# Patient Record
Sex: Female | Born: 1985 | Race: White | Hispanic: No | Marital: Married | Smoking: Never smoker
Health system: Southern US, Community
[De-identification: ages and names within clinical notes are randomized; demographics above are authoritative.]

## PROBLEM LIST (undated history)

## (undated) DIAGNOSIS — F419 Anxiety disorder, unspecified: Secondary | ICD-10-CM

## (undated) DIAGNOSIS — N200 Calculus of kidney: Secondary | ICD-10-CM

## (undated) DIAGNOSIS — I10 Essential (primary) hypertension: Secondary | ICD-10-CM

## (undated) HISTORY — PX: TONSILLECTOMY: SUR1361

## (undated) HISTORY — PX: OTHER SURGICAL HISTORY: SHX169

---

## 2019-06-27 NOTE — L&D Delivery Note (Signed)
OB/GYN Faculty Practice Delivery Note  . Alexandra George is a 34 y.o. G3P1002 s/p SVD at [redacted]w[redacted]d.     Delivery Date/Time: 1351 on 10/19  Delivery: Called to room by RN and patient was complete and pushing. Head delivered LOA. No nuchal cord present. Shoulder and body delivered in usual fashion. Infant with spontaneous cry, placed on mother's abdomen, dried and stimulated. Cord clamped x 2 after 1-minute delay, and cut by FOB. Cord blood drawn. Dr. Amado Nash arrived at this time and took over patient's care.    Complications: None Analgesia: Epidural   [name], MD [facility] Family Medicine Fellow, [facility] for [facility], [facility] 04/13/2020, 2:02 PM

## 2019-08-19 DIAGNOSIS — Z3689 Encounter for other specified antenatal screening: Secondary | ICD-10-CM | POA: Diagnosis not present

## 2019-08-19 DIAGNOSIS — Z3200 Encounter for pregnancy test, result unknown: Secondary | ICD-10-CM | POA: Diagnosis not present

## 2019-09-08 DIAGNOSIS — Z3A01 Less than 8 weeks gestation of pregnancy: Secondary | ICD-10-CM | POA: Diagnosis not present

## 2019-09-08 DIAGNOSIS — O418X10 Other specified disorders of amniotic fluid and membranes, first trimester, not applicable or unspecified: Secondary | ICD-10-CM | POA: Diagnosis not present

## 2019-10-01 DIAGNOSIS — O418X10 Other specified disorders of amniotic fluid and membranes, first trimester, not applicable or unspecified: Secondary | ICD-10-CM | POA: Diagnosis not present

## 2019-10-01 DIAGNOSIS — Z3A11 11 weeks gestation of pregnancy: Secondary | ICD-10-CM | POA: Diagnosis not present

## 2019-10-01 DIAGNOSIS — Z3689 Encounter for other specified antenatal screening: Secondary | ICD-10-CM | POA: Diagnosis not present

## 2019-10-01 DIAGNOSIS — Z3481 Encounter for supervision of other normal pregnancy, first trimester: Secondary | ICD-10-CM | POA: Diagnosis not present

## 2019-10-01 DIAGNOSIS — Z118 Encounter for screening for other infectious and parasitic diseases: Secondary | ICD-10-CM | POA: Diagnosis not present

## 2019-10-01 LAB — OB RESULTS CONSOLE RUBELLA ANTIBODY, IGM: Rubella: IMMUNE

## 2019-10-01 LAB — OB RESULTS CONSOLE HEPATITIS B SURFACE ANTIGEN: Hepatitis B Surface Ag: NEGATIVE

## 2019-10-01 LAB — OB RESULTS CONSOLE HIV ANTIBODY (ROUTINE TESTING): HIV: NONREACTIVE

## 2019-10-03 ENCOUNTER — Inpatient Hospital Stay (HOSPITAL_COMMUNITY): Admission: AD | Admit: 2019-10-03 | Payer: Self-pay | Source: Home / Self Care | Admitting: Obstetrics & Gynecology

## 2019-10-23 DIAGNOSIS — Z3481 Encounter for supervision of other normal pregnancy, first trimester: Secondary | ICD-10-CM | POA: Diagnosis not present

## 2019-10-30 DIAGNOSIS — Z3A15 15 weeks gestation of pregnancy: Secondary | ICD-10-CM | POA: Diagnosis not present

## 2019-10-30 DIAGNOSIS — Z361 Encounter for antenatal screening for raised alphafetoprotein level: Secondary | ICD-10-CM | POA: Diagnosis not present

## 2019-10-30 DIAGNOSIS — O418X10 Other specified disorders of amniotic fluid and membranes, first trimester, not applicable or unspecified: Secondary | ICD-10-CM | POA: Diagnosis not present

## 2019-11-27 DIAGNOSIS — O418X10 Other specified disorders of amniotic fluid and membranes, first trimester, not applicable or unspecified: Secondary | ICD-10-CM | POA: Diagnosis not present

## 2019-11-27 DIAGNOSIS — Z363 Encounter for antenatal screening for malformations: Secondary | ICD-10-CM | POA: Diagnosis not present

## 2019-11-27 DIAGNOSIS — Z3A19 19 weeks gestation of pregnancy: Secondary | ICD-10-CM | POA: Diagnosis not present

## 2019-12-12 DIAGNOSIS — O418X20 Other specified disorders of amniotic fluid and membranes, second trimester, not applicable or unspecified: Secondary | ICD-10-CM | POA: Diagnosis not present

## 2019-12-12 DIAGNOSIS — Z362 Encounter for other antenatal screening follow-up: Secondary | ICD-10-CM | POA: Diagnosis not present

## 2019-12-12 DIAGNOSIS — Z3A21 21 weeks gestation of pregnancy: Secondary | ICD-10-CM | POA: Diagnosis not present

## 2020-01-09 DIAGNOSIS — O418X20 Other specified disorders of amniotic fluid and membranes, second trimester, not applicable or unspecified: Secondary | ICD-10-CM | POA: Diagnosis not present

## 2020-01-09 DIAGNOSIS — Z3A25 25 weeks gestation of pregnancy: Secondary | ICD-10-CM | POA: Diagnosis not present

## 2020-01-22 DIAGNOSIS — Z3A27 27 weeks gestation of pregnancy: Secondary | ICD-10-CM | POA: Diagnosis not present

## 2020-01-22 DIAGNOSIS — Z3689 Encounter for other specified antenatal screening: Secondary | ICD-10-CM | POA: Diagnosis not present

## 2020-01-22 DIAGNOSIS — O418X20 Other specified disorders of amniotic fluid and membranes, second trimester, not applicable or unspecified: Secondary | ICD-10-CM | POA: Diagnosis not present

## 2020-02-04 DIAGNOSIS — Z3482 Encounter for supervision of other normal pregnancy, second trimester: Secondary | ICD-10-CM | POA: Diagnosis not present

## 2020-02-04 DIAGNOSIS — Z3689 Encounter for other specified antenatal screening: Secondary | ICD-10-CM | POA: Diagnosis not present

## 2020-02-04 LAB — OB RESULTS CONSOLE RPR: RPR: NONREACTIVE

## 2020-02-18 DIAGNOSIS — O9963 Diseases of the digestive system complicating the puerperium: Secondary | ICD-10-CM | POA: Diagnosis not present

## 2020-02-18 DIAGNOSIS — Z3A31 31 weeks gestation of pregnancy: Secondary | ICD-10-CM | POA: Diagnosis not present

## 2020-02-18 DIAGNOSIS — O3663X0 Maternal care for excessive fetal growth, third trimester, not applicable or unspecified: Secondary | ICD-10-CM | POA: Diagnosis not present

## 2020-02-22 ENCOUNTER — Inpatient Hospital Stay (HOSPITAL_COMMUNITY)
Admission: AD | Admit: 2020-02-22 | Discharge: 2020-02-23 | Disposition: A | Discharge status: Home / Self Care | Payer: BC Managed Care – PPO | Attending: Obstetrics and Gynecology | Admitting: Obstetrics and Gynecology

## 2020-02-22 ENCOUNTER — Other Ambulatory Visit: Payer: Self-pay

## 2020-02-22 ENCOUNTER — Encounter (HOSPITAL_COMMUNITY): Payer: Self-pay | Admitting: Obstetrics and Gynecology

## 2020-02-22 ENCOUNTER — Inpatient Hospital Stay (HOSPITAL_COMMUNITY): Payer: BC Managed Care – PPO

## 2020-02-22 DIAGNOSIS — O26893 Other specified pregnancy related conditions, third trimester: Secondary | ICD-10-CM | POA: Diagnosis not present

## 2020-02-22 DIAGNOSIS — Z3A31 31 weeks gestation of pregnancy: Secondary | ICD-10-CM | POA: Diagnosis not present

## 2020-02-22 DIAGNOSIS — R319 Hematuria, unspecified: Secondary | ICD-10-CM | POA: Diagnosis not present

## 2020-02-22 DIAGNOSIS — Z3689 Encounter for other specified antenatal screening: Secondary | ICD-10-CM

## 2020-02-22 DIAGNOSIS — Z880 Allergy status to penicillin: Secondary | ICD-10-CM | POA: Diagnosis not present

## 2020-02-22 DIAGNOSIS — M545 Low back pain, unspecified: Secondary | ICD-10-CM

## 2020-02-22 LAB — CBC WITH DIFFERENTIAL/PLATELET
Abs Immature Granulocytes: 0.43 10*3/uL — ABNORMAL HIGH (ref 0.00–0.07)
Basophils Absolute: 0.1 10*3/uL (ref 0.0–0.1)
Basophils Relative: 0 %
Eosinophils Absolute: 0.1 10*3/uL (ref 0.0–0.5)
Eosinophils Relative: 1 %
HCT: 32.9 % — ABNORMAL LOW (ref 36.0–46.0)
Hemoglobin: 10.6 g/dL — ABNORMAL LOW (ref 12.0–15.0)
Immature Granulocytes: 3 %
Lymphocytes Relative: 9 %
Lymphs Abs: 1.2 10*3/uL (ref 0.7–4.0)
MCH: 29.9 pg (ref 26.0–34.0)
MCHC: 32.2 g/dL (ref 30.0–36.0)
MCV: 92.9 fL (ref 80.0–100.0)
Monocytes Absolute: 0.8 10*3/uL (ref 0.1–1.0)
Monocytes Relative: 6 %
Neutro Abs: 10.2 10*3/uL — ABNORMAL HIGH (ref 1.7–7.7)
Neutrophils Relative %: 81 %
Platelets: 280 10*3/uL (ref 150–400)
RBC: 3.54 MIL/uL — ABNORMAL LOW (ref 3.87–5.11)
RDW: 14.8 % (ref 11.5–15.5)
WBC: 12.8 10*3/uL — ABNORMAL HIGH (ref 4.0–10.5)
nRBC: 0 % (ref 0.0–0.2)

## 2020-02-22 LAB — URINALYSIS, ROUTINE W REFLEX MICROSCOPIC
Bilirubin Urine: NEGATIVE
Glucose, UA: NEGATIVE mg/dL
Ketones, ur: 20 mg/dL — AB
Leukocytes,Ua: NEGATIVE
Nitrite: NEGATIVE
Protein, ur: 30 mg/dL — AB
RBC / HPF: >50 RBC/hpf — ABNORMAL HIGH (ref 0–5)
Specific Gravity, Urine: 1.017 (ref 1.005–1.030)
pH: 6 (ref 5.0–8.0)

## 2020-02-22 LAB — COMPREHENSIVE METABOLIC PANEL
ALT: 20 U/L (ref 0–44)
AST: 18 U/L (ref 15–41)
Albumin: 2.7 g/dL — ABNORMAL LOW (ref 3.5–5.0)
Alkaline Phosphatase: 60 U/L (ref 38–126)
Anion gap: 10 (ref 5–15)
BUN: 5 mg/dL — ABNORMAL LOW (ref 6–20)
CO2: 22 mmol/L (ref 22–32)
Calcium: 8.9 mg/dL (ref 8.9–10.3)
Chloride: 105 mmol/L (ref 98–111)
Creatinine, Ser: 0.98 mg/dL (ref 0.44–1.00)
GFR calc Af Amer: >60 mL/min (ref >60)
GFR calc non Af Amer: >60 mL/min (ref >60)
Glucose, Bld: 92 mg/dL (ref 70–99)
Potassium: 3.5 mmol/L (ref 3.5–5.1)
Sodium: 137 mmol/L (ref 135–145)
Total Bilirubin: 1.4 mg/dL — ABNORMAL HIGH (ref 0.3–1.2)
Total Protein: 5.6 g/dL — ABNORMAL LOW (ref 6.5–8.1)

## 2020-02-22 MED ORDER — HYDROMORPHONE HCL 1 MG/ML IJ SOLN
1.0000 mg | Freq: Once | INTRAMUSCULAR | Status: AC
  Administered 2020-02-22: 1 mg via INTRAVENOUS
  Filled 2020-02-22: qty 1

## 2020-02-22 MED ORDER — SODIUM CHLORIDE 0.9 % IV SOLN
8.0000 mg | Freq: Once | INTRAVENOUS | Status: AC
  Administered 2020-02-22: 8 mg via INTRAVENOUS
  Filled 2020-02-22: qty 4

## 2020-02-22 MED ORDER — LACTATED RINGERS IV SOLN: Freq: Once | INTRAVENOUS | Status: AC

## 2020-02-22 MED ORDER — LACTATED RINGERS IV SOLN: INTRAVENOUS | Status: DC

## 2020-02-22 MED ORDER — CYCLOBENZAPRINE HCL 5 MG PO TABS
10.0000 mg | ORAL_TABLET | Freq: Once | ORAL | Status: AC
  Administered 2020-02-23: 10 mg via ORAL
  Filled 2020-02-22: qty 2

## 2020-02-22 NOTE — MAU Provider Note (Addendum)
History     CSN: [id number]  Arrival date and time: 02/22/20 9485   First Provider Initiated Contact with Patient 02/22/20 1919      Chief Complaint  Patient presents with  . Back Pain   HPI Alexandra George 34 y.o. [redacted]w[redacted]d Comes to MAU today with severe left lower back pain that radiates to left abdomen.  Awakened after a nap today with this sudden onset of severe back pain with nausea.  Called the on call MD and was advised to come to MAU as it sounded like a kidney stone.  On arrival, pain is still present but nausea has eased.    OB History    Gravida  3   Para  1   Term  1   Preterm      AB      Living  2     SAB      TAB      Ectopic      Multiple      Live Births  2           Past Medical History:  Diagnosis Date  . Medical history non-contributory     Past Surgical History:  Procedure Laterality Date  . eardrum     . TONSILLECTOMY      History reviewed. No pertinent family history.  Social History   Tobacco Use  . Smoking status: Never Smoker  . Smokeless tobacco: Never Used  Substance Use Topics  . Alcohol use: Never  . Drug use: Never    Allergies:  Allergies  Allergen Reactions  . Penicillins Rash    No medications prior to admission.    Review of Systems  Constitutional: Negative for fever.  Respiratory: Negative for cough and shortness of breath.   Gastrointestinal: Positive for abdominal pain and nausea. Negative for vomiting.  Genitourinary: Negative for dysuria, vaginal bleeding and vaginal discharge.  Musculoskeletal: Positive for back pain.   Physical Exam   Blood pressure 131/80, pulse 78, temperature 98.6 F (37 C), resp. rate 16, SpO2 99 %.  Patient Vitals for the past 24 hrs:  BP Temp Pulse Resp SpO2  02/22/20 1844 131/80 98.6 F (37 C) 78 16 99 %   Physical Exam Vitals and nursing note reviewed.  Constitutional:      Appearance: She is well-developed.  HENT:     Head: Normocephalic.  Abdominal:      Palpations: Abdomen is soft.     Tenderness: There is abdominal tenderness. There is no guarding or rebound.     Comments: Tender in LLQ and radiating toward midline  Musculoskeletal:        General: Normal range of motion.     Cervical back: Neck supple.     Comments: Pain in lower left back just above pelvic crest.  No CVA tenderness bilaterally.  Skin:    General: Skin is warm and dry.  Neurological:     Mental Status: She is alert and oriented to person, place, and time.     MAU Course  Procedures LABS Results for orders placed or performed during the hospital encounter of 02/22/20 (from the past 24 hour(s))  Urinalysis, Routine w reflex microscopic Urine, Clean Catch     Status: Abnormal   Collection Time: 02/22/20  6:34 PM  Result Value Ref Range   Color, Urine YELLOW YELLOW   APPearance HAZY (A) CLEAR   Specific Gravity, Urine 1.017 1.005 - 1.030   pH 6.0 5.0 - 8.0  Glucose, UA NEGATIVE NEGATIVE mg/dL   Hgb urine dipstick LARGE (A) NEGATIVE   Bilirubin Urine NEGATIVE NEGATIVE   Ketones, ur 20 (A) NEGATIVE mg/dL   Protein, ur 30 (A) NEGATIVE mg/dL   Nitrite NEGATIVE NEGATIVE   Leukocytes,Ua NEGATIVE NEGATIVE   RBC / HPF >50 (H) 0 - 5 RBC/hpf   WBC, UA 6-10 0 - 5 WBC/hpf   Bacteria, UA RARE (A) NONE SEEN   Squamous Epithelial / LPF 11-20 0 - 5   Mucus PRESENT    Hyaline Casts, UA PRESENT   CBC with Differential/Platelet     Status: Abnormal   Collection Time: 02/22/20 11:19 PM  Result Value Ref Range   WBC 12.8 (H) 4.0 - 10.5 K/uL   RBC 3.54 (L) 3.87 - 5.11 MIL/uL   Hemoglobin 10.6 (L) 12.0 - 15.0 g/dL   HCT 31.4 (L) 36 - 46 %   MCV 92.9 80.0 - 100.0 fL   MCH 29.9 26.0 - 34.0 pg   MCHC 32.2 30.0 - 36.0 g/dL   RDW 97.0 26.3 - 78.5 %   Platelets 280 150 - 400 K/uL   nRBC 0.0 0.0 - 0.2 %   Neutrophils Relative % 81 %   Neutro Abs 10.2 (H) 1.7 - 7.7 K/uL   Lymphocytes Relative 9 %   Lymphs Abs 1.2 0.7 - 4.0 K/uL   Monocytes Relative 6 %   Monocytes Absolute  0.8 0 - 1 K/uL   Eosinophils Relative 1 %   Eosinophils Absolute 0.1 0 - 0 K/uL   Basophils Relative 0 %   Basophils Absolute 0.1 0 - 0 K/uL   Immature Granulocytes 3 %   Abs Immature Granulocytes 0.43 (H) 0.00 - 0.07 K/uL  Comprehensive metabolic panel     Status: Abnormal   Collection Time: 02/22/20 11:19 PM  Result Value Ref Range   Sodium 137 135 - 145 mmol/L   Potassium 3.5 3.5 - 5.1 mmol/L   Chloride 105 98 - 111 mmol/L   CO2 22 22 - 32 mmol/L   Glucose, Bld 92 70 - 99 mg/dL   BUN 5 (L) 6 - 20 mg/dL   Creatinine, Ser 8.85 0.44 - 1.00 mg/dL   Calcium 8.9 8.9 - 02.7 mg/dL   Total Protein 5.6 (L) 6.5 - 8.1 g/dL   Albumin 2.7 (L) 3.5 - 5.0 g/dL   AST 18 15 - 41 U/L   ALT 20 0 - 44 U/L   Alkaline Phosphatase 60 38 - 126 U/L   Total Bilirubin 1.4 (H) 0.3 - 1.2 mg/dL   GFR calc non Af Amer >60 >60 mL/min   GFR calc Af Amer >60 >60 mL/min   Anion gap 10 5 - 15    MDM Urinalysis shows blood in urine and casts which is consistent with clinical picture that could be kidney stones.  OB urine culture sent to rule out any infection.  IVF started to hydrate and flush kidneys.  Renal US ordered. Ultrasound results reviewed with client.  No evidence of stone however urine results and clinical picture likely indicate stone.  Client now still having pain and vomiting.  Asking for pain medication.  Will have urine strainer at bedside. Dilaudid 1 mg IV ordered.  Care assumed by N. Tramane Gorum, NP at 2115  -pt seen and evaluated in-person -pt endorses pain in left lower back radiating around to LLQ since 3pm this afternoon. Pt reports onset of pain was abrupt and is currently at 7/10 even after Dilaudid. Patient  reports prior to Dilaudid it was 8/10. Patient endorses vomiting x1 in MAU, but states that she does not feel nauseous, but believes it is the pain that is making her vomit. Pt declines additional medication for N/V at this time. Normal abdominal exam, tenderness over posterior aspect of  iliac crest on left side and slightly superior to. Consulted with Dr. Despina Hidden who recommends trying Flexeril and obtaining CBC and CMP to look at white count. -CBC: WBCs 12.8, H/H 10.6/32.9 -CMP: total bilirubin 1.4 -EFM: reactive       -baseline: 145       -variability: moderate       -accels: present, 15x15       -decels: absent       -TOCO: irritability -on reexamination, pt reports pain still continues to be 12/31/08 and has vomited x2 in MAU and is unsure if she can keep down Flexeril. Zofran was not successful for her in treating her nausea and vomiting. Will try phenergan 12.5mg  IV and pending success will try Flexeril. -after phenergan and Flexeril. N/V has resolved and pain now 4/10 -pt discharged to home in stable condition   Orders Placed This Encounter  Procedures  . Culture, OB Urine    Standing Status:   Standing    Number of Occurrences:   1  . US RENAL    Sudden onset radiating to left lower abdomen, nauseated with the pain at onset    Standing Status:   Standing    Number of Occurrences:   1    Order Specific Question:   Symptom/Reason for Exam    Answer:   Left low back pain [[id number]]  . Urinalysis, Routine w reflex microscopic Urine, Clean Catch    Standing Status:   Standing    Number of Occurrences:   1  . CBC with Differential/Platelet    Standing Status:   Standing    Number of Occurrences:   1  . Comprehensive metabolic panel    Standing Status:   Standing    Number of Occurrences:   1  . Discharge patient    Order Specific Question:   Discharge disposition    Answer:   01-Home or Self Care [1]    Order Specific Question:   Discharge patient date    Answer:   02/23/2020   Meds ordered this encounter  Medications  . lactated ringers infusion  . lactated ringers infusion  . ondansetron (ZOFRAN) 8 mg in sodium chloride 0.9 % 50 mL IVPB  . HYDROmorphone (DILAUDID) injection 1 mg  . cyclobenzaprine (FLEXERIL) tablet 10 mg  . promethazine (PHENERGAN)  injection 12.5 mg  . lactated ringers infusion  . cyclobenzaprine (FLEXERIL) 10 MG tablet    Sig: Take 1 tablet (10 mg total) by mouth 3 (three) times daily as needed.    Dispense:  30 tablet    Refill:  0    Order Specific Question:   Supervising Provider    Answer:   Duane Lope H [[id number]]    Assessment and Plan   1. Low back pain during pregnancy in third trimester   2. Left low back pain   3. [redacted] weeks gestation of pregnancy   4. NST (non-stress test) reactive     Allergies as of 02/23/2020      Reactions   Penicillins Rash      Medication List    TAKE these medications   cyclobenzaprine 10 MG tablet Commonly known as: FLEXERIL Take 1 tablet (10 mg  total) by mouth 3 (three) times daily as needed.       -RX Flexeril -can take Tylenol and Flexeril -safe meds in pregnancy list given -return MAU precautions  -pt discharged to home in stable condition  Joni Reiningicole E Rhyder Koegel 02/23/2020, 2:41 AM

## 2020-02-22 NOTE — MAU Note (Signed)
Pt reports to mau with c/o lower left sided back pain that radiates around to her abd.  Pt reports sudden onset of pain after waking up from a nap around 1500 this afternoon.  Pt reports some slight burning during urination this afternoon.  Pt states she took 2 ex strength tylenol around 1640 but threw up about 20 min later.  Pt denies vag bleeding or lof, and reports good fm.

## 2020-02-23 DIAGNOSIS — Z3A31 31 weeks gestation of pregnancy: Secondary | ICD-10-CM | POA: Diagnosis not present

## 2020-02-23 DIAGNOSIS — M545 Low back pain: Secondary | ICD-10-CM | POA: Diagnosis not present

## 2020-02-23 DIAGNOSIS — O26893 Other specified pregnancy related conditions, third trimester: Secondary | ICD-10-CM

## 2020-02-23 LAB — CULTURE, OB URINE

## 2020-02-23 MED ORDER — CYCLOBENZAPRINE HCL 10 MG PO TABS: 10.0000 mg | ORAL_TABLET | Freq: Three times a day (TID) | ORAL | 0 refills | Status: DC | PRN

## 2020-02-23 MED ORDER — PROMETHAZINE HCL 25 MG/ML IJ SOLN
12.5000 mg | Freq: Once | INTRAMUSCULAR | Status: AC
  Administered 2020-02-23: 12.5 mg via INTRAVENOUS
  Filled 2020-02-23: qty 1

## 2020-02-23 MED ORDER — LACTATED RINGERS IV SOLN: Freq: Once | INTRAVENOUS | Status: AC

## 2020-02-23 NOTE — Discharge Instructions (Signed)
Back Pain in Pregnancy Back pain during pregnancy is common. Back pain may be caused by several factors that are related to changes during your pregnancy. Follow these instructions at home: Managing pain, stiffness, and swelling      If directed, for sudden (acute) back pain, put ice on the painful area. ? Put ice in a plastic bag. ? Place a towel between your skin and the bag. ? Leave the ice on for 20 minutes, 2-3 times per day.  If directed, apply heat to the affected area before you exercise. Use the heat source that your health care provider recommends, such as a moist heat pack or a heating pad. ? Place a towel between your skin and the heat source. ? Leave the heat on for 20-30 minutes. ? Remove the heat if your skin turns bright red. This is especially important if you are unable to feel pain, heat, or cold. You may have a greater risk of getting burned.  If directed, massage the affected area. Activity  Exercise as told by your health care provider. Gentle exercise is the best way to prevent or manage back pain.  Listen to your body when lifting. If lifting hurts, ask for help or bend your knees. This uses your leg muscles instead of your back muscles.  Squat down when picking up something from the floor. Do not bend over.  Only use bed rest for short periods as told by your health care provider. Bed rest should only be used for the most severe episodes of back pain. Standing, sitting, and lying down  Do not stand in one place for long periods of time.  Use good posture when sitting. Make sure your head rests over your shoulders and is not hanging forward. Use a pillow on your lower back if necessary.  Try sleeping on your side, preferably the left side, with a pregnancy support pillow or 1-2 regular pillows between your legs. ? If you have back pain after a night's rest, your bed may be too soft. ? A firm mattress may provide more support for your back during  pregnancy. General instructions  Do not wear high heels.  Eat a healthy diet. Try to gain weight within your health care provider's recommendations.  Use a maternity girdle, elastic sling, or back brace as told by your health care provider.  Take over-the-counter and prescription medicines only as told by your health care provider.  Work with a physical therapist or massage therapist to find ways to manage back pain. Acupuncture or massage therapy may be helpful.  Keep all follow-up visits as told by your health care provider. This is important. Contact a health care provider if:  Your back pain interferes with your daily activities.  You have increasing pain in other parts of your body. Get help right away if:  You develop numbness, tingling, weakness, or problems with the use of your arms or legs.  You develop severe back pain that is not controlled with medicine.  You have a change in bowel or bladder control.  You develop shortness of breath, dizziness, or you faint.  You develop nausea, vomiting, or sweating.  You have back pain that is a rhythmic, cramping pain similar to labor pains. Labor pain is usually 1-2 minutes apart, lasts for about 1 minute, and involves a bearing down feeling or pressure in your pelvis.  You have back pain and your water breaks or you have vaginal bleeding.  You have back pain or numbness  that travels down your leg.  Your back pain developed after you fell.  You develop pain on one side of your back.  You see blood in your urine.  You develop skin blisters in the area of your back pain. Summary  Back pain may be caused by several factors that are related to changes during your pregnancy.  Follow instructions as told by your health care provider for managing pain, stiffness, and swelling.  Exercise as told by your health care provider. Gentle exercise is the best way to prevent or manage back pain.  Take over-the-counter and  prescription medicines only as told by your health care provider.  Keep all follow-up visits as told by your health care provider. This is important. This information is not intended to replace advice given to you by your health care provider. Make sure you discuss any questions you have with your health care provider. Document Revised: 10/01/2018 Document Reviewed: 11/28/2017 Elsevier Patient Education  2020 [employer].                        Safe Medications in Pregnancy    Acne: Benzoyl Peroxide Salicylic Acid  Backache/Headache: Tylenol: 2 regular strength every 4 hours OR              2 Extra strength every 6 hours  Colds/Coughs/Allergies: Benadryl (alcohol free) 25 mg every 6 hours as needed Breath right strips Claritin Cepacol throat lozenges Chloraseptic throat spray Cold-Eeze- up to three times per day Cough drops, alcohol free Flonase (by prescription only) Guaifenesin Mucinex Robitussin DM (plain only, alcohol free) Saline nasal spray/drops Sudafed (pseudoephedrine) & Actifed ** use only after [redacted] weeks gestation and if you do not have high blood pressure Tylenol Vicks Vaporub Zinc lozenges Zyrtec   Constipation: Colace Ducolax suppositories Fleet enema Glycerin suppositories Metamucil Milk of magnesia Miralax Senokot Smooth move tea  Diarrhea: Kaopectate Imodium A-D  *NO pepto Bismol  Hemorrhoids: Anusol Anusol HC Preparation H Tucks  Indigestion: Tums Maalox Mylanta Zantac  Pepcid  Insomnia: Benadryl (alcohol free) 25mg  every 6 hours as needed Tylenol PM Unisom, no Gelcaps  Leg Cramps: Tums MagGel  Nausea/Vomiting:  Bonine Dramamine Emetrol Ginger extract Sea bands Meclizine  Nausea medication to take during pregnancy:  Unisom (doxylamine succinate 25 mg tablets) Take one tablet daily at bedtime. If symptoms are not adequately controlled, the dose can be increased to a maximum recommended dose of two tablets daily  (1/2 tablet in the morning, 1/2 tablet mid-afternoon and one at bedtime). Vitamin B6 100mg  tablets. Take one tablet twice a day (up to 200 mg per day).  Skin Rashes: Aveeno products Benadryl cream or 25mg  every 6 hours as needed Calamine Lotion 1% cortisone cream  Yeast infection: Gyne-lotrimin 7 Monistat 7   **If taking multiple medications, please check labels to avoid duplicating the same active ingredients **take medication as directed on the label ** Do not exceed 4000 mg of tylenol in 24 hours **Do not take medications that contain aspirin or ibuprofen           Preterm Labor and Birth Information  The normal length of a pregnancy is 39-41 weeks. Preterm labor is when labor starts before 37 completed weeks of pregnancy. What are the risk factors for preterm labor? Preterm labor is more likely to occur in women who:  Have certain infections during pregnancy such as a bladder infection, sexually transmitted infection, or infection inside the uterus (chorioamnionitis).  Have a shorter-than-normal  cervix.  Have gone into preterm labor before.  Have had surgery on their cervix.  Are younger than age 79 or older than age 64.  Are African American.  Are pregnant with twins or multiple babies (multiple gestation).  Take street drugs or smoke while pregnant.  Do not gain enough weight while pregnant.  Became pregnant shortly after having been pregnant. What are the symptoms of preterm labor? Symptoms of preterm labor include:  Cramps similar to those that can happen during a menstrual period. The cramps may happen with diarrhea.  Pain in the abdomen or lower back.  Regular uterine contractions that may feel like tightening of the abdomen.  A feeling of increased pressure in the pelvis.  Increased watery or bloody mucus discharge from the vagina.  Water breaking (ruptured amniotic sac). Why is it important to recognize signs of preterm labor? It is  important to recognize signs of preterm labor because babies who are born prematurely may not be fully developed. This can put them at an increased risk for:  Long-term (chronic) heart and lung problems.  Difficulty immediately after birth with regulating body systems, including blood sugar, body temperature, heart rate, and breathing rate.  Bleeding in the brain.  Cerebral palsy.  Learning difficulties.  Death. These risks are highest for babies who are born before 34 weeks of pregnancy. How is preterm labor treated? Treatment depends on the length of your pregnancy, your condition, and the health of your baby. It may involve:  Having a stitch (suture) placed in your cervix to prevent your cervix from opening too early (cerclage).  Taking or being given medicines, such as: ? Hormone medicines. These may be given early in pregnancy to help support the pregnancy. ? Medicine to stop contractions. ? Medicines to help mature the baby's lungs. These may be prescribed if the risk of delivery is high. ? Medicines to prevent your baby from developing cerebral palsy. If the labor happens before 34 weeks of pregnancy, you may need to stay in the hospital. What should I do if I think I am in preterm labor? If you think that you are going into preterm labor, call your health care provider right away. How can I prevent preterm labor in future pregnancies? To increase your chance of having a full-term pregnancy:  Do not use any tobacco products, such as cigarettes, chewing tobacco, and e-cigarettes. If you need help quitting, ask your health care provider.  Do not use street drugs or medicines that have not been prescribed to you during your pregnancy.  Talk with your health care provider before taking any herbal supplements, even if you have been taking them regularly.  Make sure you gain a healthy amount of weight during your pregnancy.  Watch for infection. If you think that you might have  an infection, get it checked right away.  Make sure to tell your health care provider if you have gone into preterm labor before. This information is not intended to replace advice given to you by your health care provider. Make sure you discuss any questions you have with your health care provider. Document Revised: 10/04/2018 Document Reviewed: 11/03/2015 Elsevier Patient Education  2020 [employer].

## 2020-02-26 DIAGNOSIS — O26833 Pregnancy related renal disease, third trimester: Secondary | ICD-10-CM | POA: Diagnosis not present

## 2020-02-26 DIAGNOSIS — Z3A32 32 weeks gestation of pregnancy: Secondary | ICD-10-CM | POA: Diagnosis not present

## 2020-02-26 DIAGNOSIS — E860 Dehydration: Secondary | ICD-10-CM | POA: Diagnosis not present

## 2020-02-26 DIAGNOSIS — N200 Calculus of kidney: Secondary | ICD-10-CM | POA: Diagnosis not present

## 2020-02-27 DIAGNOSIS — O3663X0 Maternal care for excessive fetal growth, third trimester, not applicable or unspecified: Secondary | ICD-10-CM | POA: Diagnosis not present

## 2020-02-27 DIAGNOSIS — Z3A32 32 weeks gestation of pregnancy: Secondary | ICD-10-CM | POA: Diagnosis not present

## 2020-02-27 DIAGNOSIS — O26832 Pregnancy related renal disease, second trimester: Secondary | ICD-10-CM | POA: Diagnosis not present

## 2020-02-27 DIAGNOSIS — B9681 Helicobacter pylori [H. pylori] as the cause of diseases classified elsewhere: Secondary | ICD-10-CM | POA: Diagnosis not present

## 2020-03-05 DIAGNOSIS — Z3A33 33 weeks gestation of pregnancy: Secondary | ICD-10-CM | POA: Diagnosis not present

## 2020-03-05 DIAGNOSIS — O3663X0 Maternal care for excessive fetal growth, third trimester, not applicable or unspecified: Secondary | ICD-10-CM | POA: Diagnosis not present

## 2020-03-19 DIAGNOSIS — Z23 Encounter for immunization: Secondary | ICD-10-CM | POA: Diagnosis not present

## 2020-03-19 DIAGNOSIS — O3663X0 Maternal care for excessive fetal growth, third trimester, not applicable or unspecified: Secondary | ICD-10-CM | POA: Diagnosis not present

## 2020-03-19 DIAGNOSIS — Z3A35 35 weeks gestation of pregnancy: Secondary | ICD-10-CM | POA: Diagnosis not present

## 2020-03-26 DIAGNOSIS — O3663X0 Maternal care for excessive fetal growth, third trimester, not applicable or unspecified: Secondary | ICD-10-CM | POA: Diagnosis not present

## 2020-03-26 DIAGNOSIS — Z3A36 36 weeks gestation of pregnancy: Secondary | ICD-10-CM | POA: Diagnosis not present

## 2020-03-26 DIAGNOSIS — Z3685 Encounter for antenatal screening for Streptococcus B: Secondary | ICD-10-CM | POA: Diagnosis not present

## 2020-03-26 LAB — OB RESULTS CONSOLE GBS: GBS: NEGATIVE

## 2020-04-02 DIAGNOSIS — O3663X0 Maternal care for excessive fetal growth, third trimester, not applicable or unspecified: Secondary | ICD-10-CM | POA: Diagnosis not present

## 2020-04-02 DIAGNOSIS — Z3A37 37 weeks gestation of pregnancy: Secondary | ICD-10-CM | POA: Diagnosis not present

## 2020-04-09 DIAGNOSIS — O26893 Other specified pregnancy related conditions, third trimester: Secondary | ICD-10-CM | POA: Diagnosis not present

## 2020-04-09 DIAGNOSIS — Z3A38 38 weeks gestation of pregnancy: Secondary | ICD-10-CM | POA: Diagnosis not present

## 2020-04-09 DIAGNOSIS — R030 Elevated blood-pressure reading, without diagnosis of hypertension: Secondary | ICD-10-CM | POA: Diagnosis not present

## 2020-04-12 ENCOUNTER — Other Ambulatory Visit: Payer: Self-pay | Admitting: Obstetrics and Gynecology

## 2020-04-12 DIAGNOSIS — Z3A38 38 weeks gestation of pregnancy: Secondary | ICD-10-CM | POA: Diagnosis not present

## 2020-04-12 DIAGNOSIS — O133 Gestational [pregnancy-induced] hypertension without significant proteinuria, third trimester: Secondary | ICD-10-CM | POA: Diagnosis not present

## 2020-04-13 ENCOUNTER — Inpatient Hospital Stay (HOSPITAL_COMMUNITY): Payer: BC Managed Care – PPO | Admitting: Anesthesiology

## 2020-04-13 ENCOUNTER — Encounter (HOSPITAL_COMMUNITY): Payer: Self-pay | Admitting: Obstetrics and Gynecology

## 2020-04-13 ENCOUNTER — Other Ambulatory Visit: Payer: Self-pay

## 2020-04-13 ENCOUNTER — Inpatient Hospital Stay (HOSPITAL_COMMUNITY)
Admission: AD | Admit: 2020-04-13 | Discharge: 2020-04-15 | DRG: 807 | Disposition: A | Discharge status: Home / Self Care | Payer: BC Managed Care – PPO | Attending: Obstetrics and Gynecology | Admitting: Obstetrics and Gynecology

## 2020-04-13 ENCOUNTER — Inpatient Hospital Stay (HOSPITAL_COMMUNITY): Payer: BC Managed Care – PPO

## 2020-04-13 DIAGNOSIS — Z20822 Contact with and (suspected) exposure to covid-19: Secondary | ICD-10-CM | POA: Diagnosis present

## 2020-04-13 DIAGNOSIS — O99214 Obesity complicating childbirth: Secondary | ICD-10-CM | POA: Diagnosis present

## 2020-04-13 DIAGNOSIS — Z3490 Encounter for supervision of normal pregnancy, unspecified, unspecified trimester: Secondary | ICD-10-CM | POA: Diagnosis present

## 2020-04-13 DIAGNOSIS — Z23 Encounter for immunization: Secondary | ICD-10-CM | POA: Diagnosis not present

## 2020-04-13 DIAGNOSIS — O134 Gestational [pregnancy-induced] hypertension without significant proteinuria, complicating childbirth: Secondary | ICD-10-CM | POA: Diagnosis not present

## 2020-04-13 DIAGNOSIS — E669 Obesity, unspecified: Secondary | ICD-10-CM | POA: Diagnosis not present

## 2020-04-13 DIAGNOSIS — O164 Unspecified maternal hypertension, complicating childbirth: Secondary | ICD-10-CM | POA: Diagnosis not present

## 2020-04-13 DIAGNOSIS — Z3A38 38 weeks gestation of pregnancy: Secondary | ICD-10-CM | POA: Diagnosis not present

## 2020-04-13 HISTORY — DX: Essential (primary) hypertension: I10

## 2020-04-13 HISTORY — DX: Calculus of kidney: N20.0

## 2020-04-13 LAB — CBC
HCT: 35.4 % — ABNORMAL LOW (ref 36.0–46.0)
HCT: 35.9 % — ABNORMAL LOW (ref 36.0–46.0)
Hemoglobin: 11.5 g/dL — ABNORMAL LOW (ref 12.0–15.0)
Hemoglobin: 11.5 g/dL — ABNORMAL LOW (ref 12.0–15.0)
MCH: 30.5 pg (ref 26.0–34.0)
MCH: 29.7 pg (ref 26.0–34.0)
MCHC: 32.5 g/dL (ref 30.0–36.0)
MCHC: 32 g/dL (ref 30.0–36.0)
MCV: 93.9 fL (ref 80.0–100.0)
MCV: 92.8 fL (ref 80.0–100.0)
Platelets: 334 10*3/uL (ref 150–400)
Platelets: 333 10*3/uL (ref 150–400)
RBC: 3.77 MIL/uL — ABNORMAL LOW (ref 3.87–5.11)
RBC: 3.87 MIL/uL (ref 3.87–5.11)
RDW: 15.2 % (ref 11.5–15.5)
RDW: 15 % (ref 11.5–15.5)
WBC: 10 10*3/uL (ref 4.0–10.5)
WBC: 14.5 10*3/uL — ABNORMAL HIGH (ref 4.0–10.5)
nRBC: 0 % (ref 0.0–0.2)
nRBC: 0 % (ref 0.0–0.2)

## 2020-04-13 LAB — TYPE AND SCREEN
ABO/RH(D): O POS
Antibody Screen: NEGATIVE

## 2020-04-13 LAB — LACTATE DEHYDROGENASE: LDH: 132 U/L (ref 98–192)

## 2020-04-13 LAB — COMPREHENSIVE METABOLIC PANEL
ALT: 17 U/L (ref 0–44)
AST: 20 U/L (ref 15–41)
Albumin: 2.7 g/dL — ABNORMAL LOW (ref 3.5–5.0)
Alkaline Phosphatase: 100 U/L (ref 38–126)
Anion gap: 13 (ref 5–15)
BUN: 8 mg/dL (ref 6–20)
CO2: 19 mmol/L — ABNORMAL LOW (ref 22–32)
Calcium: 9.2 mg/dL (ref 8.9–10.3)
Chloride: 104 mmol/L (ref 98–111)
Creatinine, Ser: 0.75 mg/dL (ref 0.44–1.00)
GFR, Estimated: >60 mL/min (ref >60)
Glucose, Bld: 94 mg/dL (ref 70–99)
Potassium: 3.4 mmol/L — ABNORMAL LOW (ref 3.5–5.1)
Sodium: 136 mmol/L (ref 135–145)
Total Bilirubin: 1.2 mg/dL (ref 0.3–1.2)
Total Protein: 6 g/dL — ABNORMAL LOW (ref 6.5–8.1)

## 2020-04-13 LAB — PROTEIN / CREATININE RATIO, URINE
Creatinine, Urine: 33.03 mg/dL
Total Protein, Urine: <6 mg/dL

## 2020-04-13 LAB — RESPIRATORY PANEL BY RT PCR (FLU A&B, COVID)
Influenza A by PCR: NEGATIVE
Influenza B by PCR: NEGATIVE
SARS Coronavirus 2 by RT PCR: NEGATIVE

## 2020-04-13 LAB — URIC ACID: Uric Acid, Serum: 6 mg/dL (ref 2.5–7.1)

## 2020-04-13 LAB — RPR: RPR Ser Ql: NONREACTIVE

## 2020-04-13 MED ORDER — LACTATED RINGERS IV SOLN: 500.0000 mL | INTRAVENOUS | Status: DC | PRN

## 2020-04-13 MED ORDER — OXYCODONE HCL 5 MG PO TABS: 10.0000 mg | ORAL_TABLET | ORAL | Status: DC | PRN

## 2020-04-13 MED ORDER — OXYTOCIN-SODIUM CHLORIDE 30-0.9 UT/500ML-% IV SOLN: 2.5000 [IU]/h | INTRAVENOUS | Status: DC

## 2020-04-13 MED ORDER — ONDANSETRON HCL 4 MG PO TABS: 4.0000 mg | ORAL_TABLET | ORAL | Status: DC | PRN

## 2020-04-13 MED ORDER — LACTATED RINGERS IV SOLN
500.0000 mL | Freq: Once | INTRAVENOUS | Status: AC
  Administered 2020-04-13: 500 mL via INTRAVENOUS

## 2020-04-13 MED ORDER — EPHEDRINE 5 MG/ML INJ: 10.0000 mg | INTRAVENOUS | Status: DC | PRN

## 2020-04-13 MED ORDER — IBUPROFEN 600 MG PO TABS
600.0000 mg | ORAL_TABLET | Freq: Four times a day (QID) | ORAL | Status: DC
  Administered 2020-04-13 – 2020-04-15 (×6): 600 mg via ORAL
  Filled 2020-04-13 (×7): qty 1

## 2020-04-13 MED ORDER — LIDOCAINE HCL (PF) 1 % IJ SOLN
30.0000 mL | INTRAMUSCULAR | Status: AC | PRN
  Administered 2020-04-13: 30 mL via SUBCUTANEOUS
  Filled 2020-04-13: qty 30

## 2020-04-13 MED ORDER — ONDANSETRON HCL 4 MG/2ML IJ SOLN: 4.0000 mg | INTRAMUSCULAR | Status: DC | PRN

## 2020-04-13 MED ORDER — SOD CITRATE-CITRIC ACID 500-334 MG/5ML PO SOLN: 30.0000 mL | ORAL | Status: DC | PRN

## 2020-04-13 MED ORDER — OXYTOCIN BOLUS FROM INFUSION
333.0000 mL | Freq: Once | INTRAVENOUS | Status: AC
  Administered 2020-04-13: 333 mL via INTRAVENOUS

## 2020-04-13 MED ORDER — TETANUS-DIPHTH-ACELL PERTUSSIS 5-2.5-18.5 LF-MCG/0.5 IM SUSP: 0.5000 mL | Freq: Once | INTRAMUSCULAR | Status: DC

## 2020-04-13 MED ORDER — WITCH HAZEL-GLYCERIN EX PADS: 1.0000 "application " | MEDICATED_PAD | CUTANEOUS | Status: DC | PRN

## 2020-04-13 MED ORDER — COCONUT OIL OIL: 1.0000 "application " | TOPICAL_OIL | Status: DC | PRN

## 2020-04-13 MED ORDER — OXYCODONE HCL 5 MG PO TABS: 5.0000 mg | ORAL_TABLET | ORAL | Status: DC | PRN

## 2020-04-13 MED ORDER — TERBUTALINE SULFATE 1 MG/ML IJ SOLN: 0.2500 mg | Freq: Once | INTRAMUSCULAR | Status: DC | PRN

## 2020-04-13 MED ORDER — DIPHENHYDRAMINE HCL 50 MG/ML IJ SOLN: 12.5000 mg | INTRAMUSCULAR | Status: DC | PRN

## 2020-04-13 MED ORDER — PHENYLEPHRINE 40 MCG/ML (10ML) SYRINGE FOR IV PUSH (FOR BLOOD PRESSURE SUPPORT): 80.0000 ug | PREFILLED_SYRINGE | INTRAVENOUS | Status: DC | PRN

## 2020-04-13 MED ORDER — PRENATAL MULTIVITAMIN CH
1.0000 | ORAL_TABLET | Freq: Every day | ORAL | Status: DC
  Administered 2020-04-14: 1 via ORAL
  Filled 2020-04-13: qty 1

## 2020-04-13 MED ORDER — SENNOSIDES-DOCUSATE SODIUM 8.6-50 MG PO TABS
2.0000 | ORAL_TABLET | ORAL | Status: DC
  Filled 2020-04-13 (×2): qty 2

## 2020-04-13 MED ORDER — DIPHENHYDRAMINE HCL 25 MG PO CAPS: 25.0000 mg | ORAL_CAPSULE | Freq: Four times a day (QID) | ORAL | Status: DC | PRN

## 2020-04-13 MED ORDER — LACTATED RINGERS IV SOLN: INTRAVENOUS | Status: DC

## 2020-04-13 MED ORDER — FENTANYL-BUPIVACAINE-NACL 0.5-0.125-0.9 MG/250ML-% EP SOLN
12.0000 mL/h | EPIDURAL | Status: DC | PRN
  Filled 2020-04-13: qty 250

## 2020-04-13 MED ORDER — ACETAMINOPHEN 325 MG PO TABS: 650.0000 mg | ORAL_TABLET | ORAL | Status: DC | PRN

## 2020-04-13 MED ORDER — SIMETHICONE 80 MG PO CHEW
80.0000 mg | CHEWABLE_TABLET | ORAL | Status: DC | PRN
  Administered 2020-04-14: 80 mg via ORAL
  Filled 2020-04-13: qty 1

## 2020-04-13 MED ORDER — LIDOCAINE HCL (PF) 1 % IJ SOLN
INTRAMUSCULAR | Status: DC | PRN
  Administered 2020-04-13: 11 mL via EPIDURAL

## 2020-04-13 MED ORDER — OXYTOCIN-SODIUM CHLORIDE 30-0.9 UT/500ML-% IV SOLN
1.0000 m[IU]/min | INTRAVENOUS | Status: DC
  Administered 2020-04-13: 2 m[IU]/min via INTRAVENOUS
  Administered 2020-04-13: 8 m[IU]/min via INTRAVENOUS
  Filled 2020-04-13: qty 500

## 2020-04-13 MED ORDER — FENTANYL CITRATE (PF) 2500 MCG/50ML IJ SOLN
INTRAMUSCULAR | Status: DC | PRN
Start: 2020-04-13 — End: 2020-04-13
  Administered 2020-04-13: 12 mL/h via EPIDURAL

## 2020-04-13 MED ORDER — BENZOCAINE-MENTHOL 20-0.5 % EX AERO: 1.0000 "application " | INHALATION_SPRAY | CUTANEOUS | Status: DC | PRN

## 2020-04-13 MED ORDER — ONDANSETRON HCL 4 MG/2ML IJ SOLN: 4.0000 mg | Freq: Four times a day (QID) | INTRAMUSCULAR | Status: DC | PRN

## 2020-04-13 MED ORDER — DIBUCAINE (PERIANAL) 1 % EX OINT: 1.0000 "application " | TOPICAL_OINTMENT | CUTANEOUS | Status: DC | PRN

## 2020-04-13 NOTE — H&P (Addendum)
Alexandra George is a 34 y.o. G3P1002 at [redacted]w[redacted]d gestation presents for IOL d/t onset GHTN at term.   Antepartum course: renal stones this pregnancy (able to pass); obesity [facility] at [facility] since 11 wks.  See complete pre-natal records  History OB History    Gravida  3   Para  1   Term  1   Preterm      AB      Living  2     SAB      TAB      Ectopic      Multiple      Live Births  2          Past Medical History:  Diagnosis Date  . Hypertension    over the last week of pregnancy  . Kidney stones    Past Surgical History:  Procedure Laterality Date  . eardrum     . TONSILLECTOMY     Family History: family history includes Cancer in her mother; Diabetes in her father; Hypertension in her father and mother. Social History:  reports that she has never smoked. She has never used smokeless tobacco. She reports that she does not drink alcohol and does not use drugs.  ROS: See above otherwise negative  Prenatal labs:  ABO, Rh:  pos Antibody:  neg Rubella:  immune RPR:   NR HBsAg:   NR HIV:  NR GBS:   neg 1 hr Glucola: passed (early and 28wk) Genetic screening: Normal Anatomy US: Normal  Physical Exam:     Height 5\' 1"  (1.549 m), weight 92.5 kg. Labs:  pending  Prenatal Transfer Tool  Maternal Diabetes: No Genetic Screening: Normal Maternal Ultrasounds/Referrals: Normal Fetal Ultrasounds or other Referrals:  None Maternal Substance Abuse:  No Significant Maternal Medications:  None Significant Maternal Lab Results: Group B Strep negative    Assessment/Plan:  34 y.o. G3P1002 at [redacted]w[redacted]d gestation   1. IOL - plan pitocin, arom; anticipate svd 2. GHTN - new onset at term, nml labs 4d ago, repeat with admission; monitor bps closely 3. Obesity 4. gbs neg 5. Rh pos 6. Rubella immune  7. Fetal status - plan continuous monitoring  [redacted]w[redacted]d 04/13/2020, 8:16 AM   CBC Latest Ref Rng & Units 04/13/2020 02/22/2020  WBC 4.0 - 10.5  K/uL 10.0 12.8(H)  Hemoglobin 12.0 - 15.0 g/dL 11.5(L) 10.6(L)  Hematocrit 36 - 46 % 35.4(L) 32.9(L)  Platelets 150 - 400 K/uL 334 280   CMP Latest Ref Rng & Units 04/13/2020 02/22/2020  Glucose 70 - 99 mg/dL 94 92  BUN 6 - 20 mg/dL 8 5(L)  Creatinine 02/24/2020 - 1.00 mg/dL 0.62 6.94  Sodium 8.54 - 145 mmol/L 136 137  Potassium 3.5 - 5.1 mmol/L 3.4(L) 3.5  Chloride 98 - 111 mmol/L 104 105  CO2 22 - 32 mmol/L 19(L) 22  Calcium 8.9 - 10.3 mg/dL 9.2 8.9  Total Protein 6.5 - 8.1 g/dL 6.0(L) 5.6(L)  Total Bilirubin 0.3 - 1.2 mg/dL 1.2 627)  Alkaline Phos 38 - 126 U/L 100 60  AST 15 - 41 U/L 20 18  ALT 0 - 44 U/L 17 20   P/c ratio: too low to calculate

## 2020-04-13 NOTE — Anesthesia Procedure Notes (Signed)
Epidural Patient location during procedure: OB Start time: 04/13/2020 11:20 AM End time: 04/13/2020 11:42 AM  Staffing Anesthesiologist: Lowella Curb, MD Performed: anesthesiologist   Preanesthetic Checklist Completed: patient identified, IV checked, site marked, risks and benefits discussed, surgical consent, monitors and equipment checked, pre-op evaluation and timeout performed  Epidural Patient position: sitting Prep: ChloraPrep Patient monitoring: heart rate, cardiac monitor, continuous pulse ox and blood pressure Approach: midline Location: L2-L3 Injection technique: LOR saline  Needle:  Needle type: Tuohy  Needle gauge: 17 G Needle length: 9 cm Needle insertion depth: 8 cm Catheter type: closed end flexible Catheter size: 20 Guage Catheter at skin depth: 12 cm Test dose: negative  Assessment Events: blood not aspirated, injection not painful, no injection resistance, no paresthesia and negative IV test  Additional Notes Epidural placed by SRNA under direct supervisionReason for block:procedure for pain

## 2020-04-13 NOTE — Progress Notes (Signed)
Delivery Note At  1350 a precipitous delivery of a viable and healthy female was delivered over intact perineum via svd  (Presentation:cephalic ; OA ).  APGAR: 8,9 ; weight  not yet done.  I arrived after uncomplicated delivery of infant, Dr. Myriam Jacobson (fellow) present. Placenta status: delivered, spontaneously and intact.  Cord: 3vv  with the following complications: none.  Anesthesia:  epidural Episiotomy:  none Lacerations:  2nd degree Suture Repair: 3.0 vicryl Est. Blood Loss (mL):   Mom to postpartum.  Baby to Couplet care / Skin to Skin.  Alexandra George 04/13/2020, 2:32 PM

## 2020-04-13 NOTE — Anesthesia Preprocedure Evaluation (Signed)
Anesthesia Evaluation  Patient identified by MRN, date of birth, ID band Patient awake    Reviewed: Allergy & Precautions, NPO status , Patient's Chart, lab work & pertinent test results  Airway Mallampati: II  TM Distance: >3 FB Neck ROM: Full    Dental no notable dental hx.    Pulmonary neg pulmonary ROS,    Pulmonary exam normal breath sounds clear to auscultation       Cardiovascular hypertension, negative cardio ROS Normal cardiovascular exam Rhythm:Regular Rate:Normal     Neuro/Psych negative neurological ROS  negative psych ROS   GI/Hepatic negative GI ROS, Neg liver ROS,   Endo/Other  negative endocrine ROS  Renal/GU negative Renal ROS  negative genitourinary   Musculoskeletal negative musculoskeletal ROS (+)   Abdominal (+) + obese,   Peds negative pediatric ROS (+)  Hematology negative hematology ROS (+)   Anesthesia Other Findings   Reproductive/Obstetrics (+) Pregnancy                             Anesthesia Physical Anesthesia Plan  ASA: II  Anesthesia Plan: Epidural   Post-op Pain Management:    Induction:   PONV Risk Score and Plan:   Airway Management Planned:   Additional Equipment:   Intra-op Plan:   Post-operative Plan:   Informed Consent:   Plan Discussed with:   Anesthesia Plan Comments:         Anesthesia Quick Evaluation  

## 2020-04-13 NOTE — Progress Notes (Signed)
Labor Progress Note  S/O: Patient now comfortable with epidural  Vitals:   04/13/20 1210 04/13/20 1230  BP: (!) 141/85   Pulse: 79   Resp: 16 17  Temp:    SpO2: 100%     SVE: 4-5/50/-2 AROM clear fluid  FHT: cat I baseline 150 bpm mod var +accels, -decels Toco: ctxs q 4-5  A/P: 34Y G3P2002 @ 38.6 IOL gHTN new onset  -IOL: cont Pitocin 2x2, now s/p SROM, history of fast labors -cont EFM/Toco -cat I  -epidural labor pain mgmt -GBS NEG -gHTN: mild elevation BP's, no severe ranges, cont to monitor closely -routine intrapartum care -anticipate vaginal delivery  Buell Parcel A Macoy Rodwell 04/13/20 12:33 PM

## 2020-04-13 NOTE — Lactation Note (Addendum)
This note was copied from a baby's chart. Lactation Consultation Note  Patient Name: Alexandra George HMCNO'B Date: 04/13/2020 Reason for consult: Initial assessment;Early term 37-38.6wks P3, 8 hour ETI female infant. Mom with hx: obesity and GHTN. Per mom, infant had one void and very small stool  Per mom, she does have DEBP at home. Per mom, she is working on Building control surveyor with more with depth, she felt slight pinch, LC unable assist with latch,infant is too sleepy at this time, infant BF less than 2 hours ago for 30 minutes. LC did not observe any nipple trauma or damage at this time.  Mom is experienced at BF, she BF her 1st child for 14 months and 2nd child who is now 4 years for one year. Mom knows to break latch if she feels pain or pinching and not a tug and re-latch infant at the breast. Mom knows to BF infant according to cues, 8 to 12+ times within 24 hours, STS. LC discussed with parents about [place] Breastfeeding Support group after Hospital discharge.  Mom knows to call RN or LC if she has further questions, concerns or needs assistance with latching infant at the breast.   Maternal Data Formula Feeding for Exclusion: No Has patient been taught Hand Expression?: Yes Does the patient have breastfeeding experience prior to this delivery?: Yes  Feeding    LATCH Score                   Interventions Interventions: Breast feeding basics reviewed;Skin to skin;Hand express;Expressed milk;Position options;Support pillows;Adjust position  Lactation Tools Discussed/Used WIC Program: No   Consult Status Consult Status: Follow-up Date: 04/14/20 Follow-up type: In-patient    Danelle Earthly 04/13/2020, 10:05 PM

## 2020-04-14 LAB — CBC
HCT: 34.5 % — ABNORMAL LOW (ref 36.0–46.0)
Hemoglobin: 11.1 g/dL — ABNORMAL LOW (ref 12.0–15.0)
MCH: 29.8 pg (ref 26.0–34.0)
MCHC: 32.2 g/dL (ref 30.0–36.0)
MCV: 92.7 fL (ref 80.0–100.0)
Platelets: 306 10*3/uL (ref 150–400)
RBC: 3.72 MIL/uL — ABNORMAL LOW (ref 3.87–5.11)
RDW: 14.9 % (ref 11.5–15.5)
WBC: 13.5 10*3/uL — ABNORMAL HIGH (ref 4.0–10.5)
nRBC: 0 % (ref 0.0–0.2)

## 2020-04-14 MED ORDER — INFLUENZA VAC SPLIT QUAD 0.5 ML IM SUSY
0.5000 mL | PREFILLED_SYRINGE | INTRAMUSCULAR | Status: AC
  Administered 2020-04-15: 0.5 mL via INTRAMUSCULAR
  Filled 2020-04-14: qty 0.5

## 2020-04-14 NOTE — Progress Notes (Signed)
PPD # 1 S/P NSVD  Live born female  Birth Weight: 8 lb 2.3 oz (3694 g) APGAR: 8, 9  Newborn Delivery   Birth date/time: 04/13/2020 13:51:00 Delivery type: Vaginal, Spontaneous     Baby name: Alexandra George Delivering provider: Rhoderick Moody George   Episiotomy:None   Lacerations:2nd degree   Feeding: breast  Pain control at delivery: Epidural   Subjective   Reports feeling well. No complaints. Breastfeeding going well. Husband at the bedside.              Tolerating po/ No nausea or vomiting             Bleeding is moderate             Pain controlled with ibuprofen (OTC)             Up ad lib / ambulatory / voiding without difficulties   Objective   A & O x 3, in no apparent distress              VS:  Vitals:   04/13/20 1700 04/13/20 2103 04/14/20 0100 04/14/20 0511  BP: 135/90 138/89 (!) 154/98 (!) 140/98  Pulse: 95 80 60 75  Resp: 18 18 18 18   Temp: 99 F (37.2 C) 98.6 F (37 C) 98.7 F (37.1 C) 98 F (36.7 C)  TempSrc: Oral Oral Oral Oral  SpO2: 98% 100% 100% 100%  Weight:      Height:        LABS:  Recent Labs    04/13/20 1448 04/14/20 0610  WBC 14.5* 13.5*  HGB 11.5* 11.1*  HCT 35.9* 34.5*  PLT 333 306     Blood type: --/--/O POS (10/19 0815)  Rubella: Immune (04/07 0000)   Vaccines:   TDaP   UTD                   Flu       At discharge                             COVID-19 UTD   Gen: AAO x 3, NAD  Abdomen: soft, non-tender, non-distended             Fundus: firm, non-tender, U-1  Perineum: repair intact, no edema  Lochia: moderate  Extremities: no edema, no calf pain or tenderness   Assessment/Plan PPD # 1 34 y.o., 05-14-1997   Principal Problem:   Postpartum care following vaginal delivery 10/19 Active Problems:   Encounter for induction of labor   SVD (spontaneous vaginal delivery) 10/19   Second degree perineal laceration   Doing well - stable status  Routine post partum orders  Breastfeeding support prn  Anticipate discharge this evening  ort tomorrow   11/19, MSN, CNM 04/14/2020, 11:57 AM

## 2020-04-14 NOTE — Lactation Note (Addendum)
This note was copied from a baby's chart. Lactation Consultation Note  Patient Name: Alexandra George ATFTD'D Date: 04/14/2020 Reason for consult: Follow-up assessment;Early term 37-38.6wks;Infant weight loss;Other (Comment) (Infant had 3% weight loss and hx Gestational Hypertension) Hyperbilirubinemia Tb 8.9 mg/ dl High intermediate risk   Infant is 38 weeks 27 hours old with 3% weight loss. Mom hx of breastfeeding 2 other children. Mom states nursing going well. Infant starts off pinchy but she is able to get a deeper latch or break the latch to adjust position. Mom feeding between 15-20 minutes based on cues. Last two feedings 1050 am for 25 minutes and 1445 for 25 minutes. According to parents infant had 1 stool and 3 urine in the last 12 hours.   Parents stated infant and family slept between 1050 and 1445 feeding. Infant feeding at the time of visit 25 minutes on the left and switched to the right and still feeding at the end of the visit.   LC noted Mom had expressed milk on the breast before latching. Infant latched with signs of milk transfer swallows and cah sound noted. LC unable to see both nipples since Mom nursing on one side and has a tank top on with only one breast exposed. Mom denies any nipple pain at the time of the visit. Pacifier noted in the infant's bed that Mom stated she had not used. LC explained to Mom better not to use pacifier for 4 weeks until establish a good latch.   LC reviewed the weight loss with parents and reviewed the breastfeeding supplementation guide based on hours after birth.  Mom did not want to pump at this time with the manual and states she will pump at home.   Plan 1. To feed based on cues 8-12x in 24 hour period no more than 4 hours without attempt. Mom to offer both breasts and look for signs of milk transfer.           2. Mom to supplement based on guidelines provided 7-12 ml with spoon feeding or other method. Parents to discuss what other  methods they are willing to supplement with.           3. Parents updated the I's/O's sheet to reflect adequately urine and fecal output.           4. Parents will call RN or LC if need further assistance.           5. LC reviewed rise in bilirubin and need for Mom to supplement with RN, Marissa Mabe.

## 2020-04-14 NOTE — Anesthesia Postprocedure Evaluation (Signed)
Anesthesia Post Note  Patient: Alexandra George  Procedure(s) Performed: AN AD HOC LABOR EPIDURAL     Patient location during evaluation: Mother Baby Anesthesia Type: Epidural Level of consciousness: awake and alert Pain management: pain level controlled Vital Signs Assessment: post-procedure vital signs reviewed and stable Respiratory status: spontaneous breathing, nonlabored ventilation and respiratory function stable Cardiovascular status: stable Postop Assessment: no headache, no backache and epidural receding Anesthetic complications: no   No complications documented.  Last Vitals:  Vitals:   04/14/20 0100 04/14/20 0511  BP: (!) 154/98 (!) 140/98  Pulse: 60 75  Resp: 18 18  Temp: 37.1 C 36.7 C  SpO2: 100% 100%    Last Pain:  Vitals:   04/14/20 0511  TempSrc: Oral  PainSc:    Pain Goal: Patients Stated Pain Goal: 8 (04/13/20 0931)                 Fanny Dance

## 2020-04-15 MED ORDER — ACETAMINOPHEN 325 MG PO TABS
650.0000 mg | ORAL_TABLET | ORAL | 1 refills | Status: DC | PRN
Start: 2020-04-15 — End: 2021-10-27

## 2020-04-15 MED ORDER — COCONUT OIL OIL: 1.0000 "application " | TOPICAL_OIL | 0 refills | Status: DC | PRN

## 2020-04-15 MED ORDER — IBUPROFEN 600 MG PO TABS
600.0000 mg | ORAL_TABLET | Freq: Four times a day (QID) | ORAL | 0 refills | Status: DC
Start: 2020-04-15 — End: 2021-10-27

## 2020-04-15 MED ORDER — BENZOCAINE-MENTHOL 20-0.5 % EX AERO: 1.0000 "application " | INHALATION_SPRAY | CUTANEOUS | 1 refills | Status: DC | PRN

## 2020-04-15 NOTE — Lactation Note (Signed)
This note was copied from a baby's chart. Lactation Consultation Note Baby 59 hrs old. Baby is starting to cluster feed. Mom has questions d/t hurts on the Rt. Nipple when baby BF. Doesn't hurt to Lt. Nipple.  Mom has everted compressible nipples. Mom is BF in football hold to Rt. Breast and cradle to Lt. Breast. Baby has labial frenulum to the bottom of top gum line. Baby moves her tongue out past lips well. LC assessed suck w/gloved finger. Baby cups tongue under finger well.  Baby sleeping after BF.  Encouraged mom to flange upper lip. Mom stated she thought baby had a good flange but will check next time.  Asked mom to try to call for [facility] again. LC was in another rm when mom call for assistance.  Encouraged mom to nap when baby naps.  Patient Name: Alexandra George BJYNW'G Date: 04/15/2020 Reason for consult: Mother's request;Early term 45-38.6wks   Maternal Data Has patient been taught Hand Expression?: Yes  Feeding    LATCH Score       Type of Nipple: Everted at rest and after stimulation  Comfort (Breast/Nipple): Soft / non-tender        Interventions Interventions: Position options;Expressed milk;Breast massage;Hand express;Breast compression  Lactation Tools Discussed/Used     Consult Status Consult Status: Follow-up Date: 04/15/20 Follow-up type: In-patient    Alexandra George, Diamond Nickel 04/15/2020, 2:21 AM

## 2020-04-15 NOTE — Lactation Note (Signed)
This note was copied from a baby's chart. Lactation Consultation Note  Patient Name: Alexandra George GBEEF'E Date: 04/15/2020 Reason for consult: Follow-up assessment;Early term 37-38.6wks;Infant weight loss;Other (Comment) (2 % weight loss ,)  Baby is 28 hours old  Per mom the baby last fed at 8:30 am, more swallows and seems more satisfied. Mom mentioned the baby cluster fed last night, breast are fuller and milk is coming in. Per mom having some soreness from cluster feedings, feels normal, no breakdown. Sore nipple and engorgement prevention and tx reviewed.  LC recommended prior to latching on the 1st breast - breast massage, hand express, pre-pump if needed and reverse pressure to enhance a deeper latch.  Mom has the [facility] hand out with resource phone numbers and website for support groups.         Maternal Data    Feeding Feeding Type:  (per mom baby recently fed)  LATCH Score                   Interventions Interventions: Breast feeding basics reviewed;Hand pump  Lactation Tools Discussed/Used Tools: Pump Breast pump type: Manual Pump Review: Milk Storage   Consult Status Consult Status: Complete Date: 04/15/20    Alexandra George 04/15/2020, 10:07 AM

## 2020-04-15 NOTE — Discharge Summary (Signed)
OB Discharge Summary  Patient Name: Alexandra George DOB: Jun 21, 1986 MRN: [id number]  Date of admission: 04/13/2020 Delivering provider: Rhoderick Moody E   Admitting diagnosis: Encounter for induction of labor [Z34.90] Intrauterine pregnancy: [redacted]w[redacted]d     Secondary diagnosis: Patient Active Problem List   Diagnosis Date Noted   SVD (spontaneous vaginal delivery) 10/19 04/14/2020   Postpartum care following vaginal delivery 10/19 04/14/2020   Second degree perineal laceration 04/14/2020   Encounter for induction of labor 04/13/2020    Date of discharge: 04/15/2020   Discharge diagnosis: Principal Problem:   Postpartum care following vaginal delivery 10/19 Active Problems:   Encounter for induction of labor   SVD (spontaneous vaginal delivery) 10/19   Second degree perineal laceration                                                           Post partum procedures:None  Augmentation: AROM Pain control: Epidural  Laceration:2nd degree  Episiotomy:None  Complications: None  Hospital course:  Induction of Labor With Vaginal Delivery   34 y.o. yo G3P2003 at [redacted]w[redacted]d was admitted to the hospital 04/13/2020 for induction of labor.  Indication for induction: Gestational hypertension.  Patient had an uncomplicated labor course as follows: Membrane Rupture Time/Date: 12:23 PM ,04/13/2020   Delivery Method:Vaginal, Spontaneous  Episiotomy: None  Lacerations:  2nd degree  Details of delivery can be found in separate delivery note.  Patient had a routine postpartum course. Patient is discharged home 04/15/20.  Newborn Data: Birth date:04/13/2020  Birth time:1:51 PM  Gender:Female  Living status:Living  Apgars:8 ,9  EXBMWU:1324 g   Physical exam  Vitals:   04/14/20 0511 04/14/20 1413 04/14/20 2147 04/15/20 0540  BP: (!) 140/98 137/88 (!) 136/97 136/88  Pulse: 75 93 75 68  Resp: 18 17 18 18   Temp: 98 F (36.7 C) 98.5 F (36.9 C) 98.2 F (36.8 C) 98.2 F (36.8 C)  TempSrc:  Oral Oral Oral Oral  SpO2: 100% 100% 100% 100%  Weight:      Height:       General: alert, cooperative and no distress Lochia: appropriate Uterine Fundus: firm Perineum: repair intact, no edema DVT Evaluation: No evidence of DVT seen on physical exam. Labs: Lab Results  Component Value Date   WBC 13.5 (H) 04/14/2020   HGB 11.1 (L) 04/14/2020   HCT 34.5 (L) 04/14/2020   MCV 92.7 04/14/2020   PLT 306 04/14/2020   CMP Latest Ref Rng & Units 04/13/2020  Glucose 70 - 99 mg/dL 94  BUN 6 - 20 mg/dL 8  Creatinine 04/15/2020 - 4.01 mg/dL 0.27  Sodium 2.53 - 664 mmol/L 136  Potassium 3.5 - 5.1 mmol/L 3.4(L)  Chloride 98 - 111 mmol/L 104  CO2 22 - 32 mmol/L 19(L)  Calcium 8.9 - 10.3 mg/dL 9.2  Total Protein 6.5 - 8.1 g/dL 6.0(L)  Total Bilirubin 0.3 - 1.2 mg/dL 1.2  Alkaline Phos 38 - 126 U/L 100  AST 15 - 41 U/L 20  ALT 0 - 44 U/L 17   Edinburgh Postnatal Depression Scale Screening Tool 04/14/2020  I have been able to laugh and see the funny side of things. 0  I have looked forward with enjoyment to things. 0  I have blamed myself unnecessarily when things went wrong. 1  I have been anxious  or worried for no good reason. 2  I have felt scared or panicky for no good reason. 0  Things have been getting on top of me. 0  I have been so unhappy that I have had difficulty sleeping. 0  I have felt sad or miserable. 0  I have been so unhappy that I have been crying. 0  The thought of harming myself has occurred to me. 0  Edinburgh Postnatal Depression Scale Total 3   Vaccines: TDaP UTD         Flu    At discharge         COVID-19   UTD  Discharge instructions:  per After Visit Summary and [facility] OB booklet  After Visit Meds:  Allergies as of 04/15/2020      Reactions   Penicillins Rash      Medication List    STOP taking these medications   cyclobenzaprine 10 MG tablet Commonly known as: FLEXERIL     TAKE these medications   acetaminophen 325 MG tablet Commonly known as:  Tylenol Take 2 tablets (650 mg total) by mouth every 4 (four) hours as needed (for pain scale < 4).   benzocaine-Menthol 20-0.5 % Aero Commonly known as: DERMOPLAST Apply 1 application topically as needed for irritation (perineal discomfort).   coconut oil Oil Apply 1 application topically as needed.   ibuprofen 600 MG tablet Commonly known as: ADVIL Take 1 tablet (600 mg total) by mouth every 6 (six) hours.   prenatal vitamin w/FE, FA 27-1 MG Tabs tablet Take 1 tablet by mouth daily at 12 noon.            Discharge Care Instructions  (From admission, onward)         Start     Ordered   04/15/20 0000  Discharge wound care:       Comments: Sitz baths 2 times /day with warm water x 1 week. May add herbals: 1 ounce dried comfrey leaf* 1 ounce calendula flowers 1 ounce lavender flowers  Supplies can be found online at [employer] sources at [employer], [employer]  1/2 ounce dried uva ursi leaves 1/2 ounce witch hazel blossoms (if you can find them) 1/2 ounce dried sage leaf 1/2 cup sea salt Directions: Bring 2 quarts of water to a boil. Turn off heat, and place 1 ounce (approximately 1 large handful) of the above mixed herbs (not the salt) into the pot. Steep, covered, for 30 minutes.  Strain the liquid well with a fine mesh strainer, and discard the herb material. Add 2 quarts of liquid to the tub, along with the 1/2 cup of salt. This medicinal liquid can also be made into compresses and peri-rinses.   04/15/20 0940         Diet: routine diet  Activity: Advance as tolerated. Pelvic rest for 6 weeks.   Newborn Data: Live born female  Birth Weight: 8 lb 2.3 oz (3694 g) APGAR: 8, 9  Newborn Delivery   Birth date/time: 04/13/2020 13:51:00 Delivery type: Vaginal, Spontaneous      Named Alexandra George Baby Feeding: Breast Disposition:home with mother  Delivery Report:  Review the Delivery Report for details.    Follow up:  Follow-up Information     Amado Nash Candace Gallus, MD. Schedule an appointment as soon as possible for a visit in 6 week(s).   Specialty: Obstetrics and Gynecology Why: Please make an appointment for 6 weeks postpartum. Contact information: [address] [place]  [address] [phone]               June Leap, CNM, MSN 04/15/2020, 9:42 AM

## 2020-08-31 DIAGNOSIS — F419 Anxiety disorder, unspecified: Secondary | ICD-10-CM | POA: Diagnosis not present

## 2020-08-31 DIAGNOSIS — Z13220 Encounter for screening for lipoid disorders: Secondary | ICD-10-CM | POA: Diagnosis not present

## 2020-08-31 DIAGNOSIS — Z0000 Encounter for general adult medical examination without abnormal findings: Secondary | ICD-10-CM | POA: Diagnosis not present

## 2020-08-31 DIAGNOSIS — R030 Elevated blood-pressure reading, without diagnosis of hypertension: Secondary | ICD-10-CM | POA: Diagnosis not present

## 2020-08-31 DIAGNOSIS — R197 Diarrhea, unspecified: Secondary | ICD-10-CM | POA: Diagnosis not present

## 2020-09-22 DIAGNOSIS — A09 Infectious gastroenteritis and colitis, unspecified: Secondary | ICD-10-CM | POA: Diagnosis not present

## 2020-09-22 DIAGNOSIS — R197 Diarrhea, unspecified: Secondary | ICD-10-CM | POA: Diagnosis not present

## 2020-09-27 DIAGNOSIS — R197 Diarrhea, unspecified: Secondary | ICD-10-CM | POA: Diagnosis not present

## 2020-09-27 DIAGNOSIS — A09 Infectious gastroenteritis and colitis, unspecified: Secondary | ICD-10-CM | POA: Diagnosis not present

## 2020-09-29 DIAGNOSIS — R030 Elevated blood-pressure reading, without diagnosis of hypertension: Secondary | ICD-10-CM | POA: Diagnosis not present

## 2020-09-29 DIAGNOSIS — F419 Anxiety disorder, unspecified: Secondary | ICD-10-CM | POA: Diagnosis not present

## 2020-11-18 DIAGNOSIS — R109 Unspecified abdominal pain: Secondary | ICD-10-CM | POA: Diagnosis not present

## 2020-11-18 DIAGNOSIS — R194 Change in bowel habit: Secondary | ICD-10-CM | POA: Diagnosis not present

## 2021-07-05 DIAGNOSIS — Z6837 Body mass index (BMI) 37.0-37.9, adult: Secondary | ICD-10-CM | POA: Diagnosis not present

## 2021-07-05 DIAGNOSIS — Z01419 Encounter for gynecological examination (general) (routine) without abnormal findings: Secondary | ICD-10-CM | POA: Diagnosis not present

## 2021-09-08 ENCOUNTER — Other Ambulatory Visit: Payer: Self-pay | Admitting: Physician Assistant

## 2021-09-08 DIAGNOSIS — R030 Elevated blood-pressure reading, without diagnosis of hypertension: Secondary | ICD-10-CM | POA: Diagnosis not present

## 2021-09-08 DIAGNOSIS — R140 Abdominal distension (gaseous): Secondary | ICD-10-CM | POA: Diagnosis not present

## 2021-09-08 DIAGNOSIS — R1013 Epigastric pain: Secondary | ICD-10-CM | POA: Diagnosis not present

## 2021-09-09 ENCOUNTER — Other Ambulatory Visit: Payer: Self-pay

## 2021-09-09 ENCOUNTER — Ambulatory Visit
Admission: RE | Admit: 2021-09-09 | Discharge: 2021-09-09 | Disposition: A | Discharge status: Home / Self Care | Payer: BC Managed Care – PPO | Source: Ambulatory Visit | Attending: Physician Assistant | Admitting: Physician Assistant

## 2021-09-09 DIAGNOSIS — R1013 Epigastric pain: Secondary | ICD-10-CM | POA: Diagnosis not present

## 2021-09-09 DIAGNOSIS — R109 Unspecified abdominal pain: Secondary | ICD-10-CM

## 2021-10-12 DIAGNOSIS — K8010 Calculus of gallbladder with chronic cholecystitis without obstruction: Secondary | ICD-10-CM | POA: Diagnosis not present

## 2021-10-26 ENCOUNTER — Ambulatory Visit: Payer: Self-pay | Admitting: Surgery

## 2021-10-26 NOTE — Progress Notes (Signed)
Sent message, via epic in basket, requesting orders in epic from surgeon.  

## 2021-10-28 NOTE — Patient Instructions (Addendum)
DUE TO COVID-19 ONLY TWO VISITORS  (aged 35 and older)  ARE ALLOWED TO COME WITH YOU AND STAY IN THE WAITING ROOM ONLY DURING PRE OP AND PROCEDURE.   ?**NO VISITORS ARE ALLOWED IN THE SHORT STAY AREA OR RECOVERY ROOM!!** ? ? Your procedure is scheduled on: 11/09/21 ? ? Report to [facility] Main Entrance ? ?  Report to admitting at 12:45 PM ? ? Call this number if you have problems the morning of surgery [phone] ? ? Do not eat food :After Midnight. ? ? After Midnight you may have the following liquids until 12:00 PM DAY OF SURGERY ? ?Water ?Black Coffee (sugar ok, NO MILK/CREAM OR CREAMERS)  ?Tea (sugar ok, NO MILK/CREAM OR CREAMERS) regular and decaf                             ?Plain Jell-O (NO RED)                                           ?Fruit ices (not with fruit pulp, NO RED)                                     ?Popsicles (NO RED)                                                                  ?Juice: apple, WHITE grape, WHITE cranberry ?Sports drinks like Gatorade (NO RED) ?Clear broth(vegetable,chicken,beef) ?FOLLOW BOWEL PREP AND ANY ADDITIONAL PRE OP INSTRUCTIONS YOU RECEIVED FROM YOUR SURGEON'S OFFICE!!! ?  ?  ?Oral Hygiene is also important to reduce your risk of infection.                                    ?Remember - BRUSH YOUR TEETH THE MORNING OF SURGERY WITH YOUR REGULAR TOOTHPASTE ? ? Take these medicines the morning of surgery with A SIP OF WATER: Zyrtec ?                  ?           You may not have any metal on your body including hair pins, jewelry, and body piercing ? ?           Do not wear make-up, lotions, powders, perfumes, or deodorant ? ?Do not wear nail polish including gel and S&S, artificial/acrylic nails, or any other type of covering on natural nails including finger and toenails. If you have artificial nails, gel coating, etc. that needs to be removed by a nail salon please have this removed prior to surgery or surgery may need to be canceled/ delayed if the  surgeon/ anesthesia feels like they are unable to be safely monitored.  ? ?Do not shave  48 hours prior to surgery.  ? ? Do not bring valuables to the hospital. [place] NOT ?            RESPONSIBLE   FOR  VALUABLES. ?  ? Patients discharged on the day of surgery will not be allowed to drive home.  Someone NEEDS to stay with you for the first 24 hours after anesthesia. ? ? Special Instructions: Bring a copy of your healthcare power of attorney and living will documents         the day of surgery if you haven't scanned them before. ? ?            Please read over the following fact sheets you were given: IF [place] [phone]- Apolonio Schneiders  ? ?   [place] - Preparing for Surgery ?Before surgery, you can play an important role.  Because skin is not sterile, your skin needs to be as free of germs as possible.  You can reduce the number of germs on your skin by washing with CHG (chlorahexidine gluconate) soap before surgery.  CHG is an antiseptic cleaner which kills germs and bonds with the skin to continue killing germs even after washing. ?Please DO NOT use if you have an allergy to CHG or antibacterial soaps.  If your skin becomes reddened/irritated stop using the CHG and inform your nurse when you arrive at Short Stay. ?Do not shave (including legs and underarms) for at least 48 hours prior to the first CHG shower.  You may shave your face/neck. ? ?Please follow these instructions carefully: ? 1.  Shower with CHG Soap the night before surgery and the  morning of surgery. ? 2.  If you choose to wash your hair, wash your hair first as usual with your normal  shampoo. ? 3.  After you shampoo, rinse your hair and body thoroughly to remove the shampoo.                            ? 4.  Use CHG as you would any other liquid soap.  You can apply chg directly to the skin and wash.  Gently with a scrungie or clean washcloth. ? 5.  Apply the CHG Soap to your body ONLY  FROM THE NECK DOWN.   Do   not use on face/ open      ?                     Wound or open sores. Avoid contact with eyes, ears mouth and   genitals (private parts).  ?                     Production manager,  Genitals (private parts) with your normal soap. ?            6.  Wash thoroughly, paying special attention to the area where your    surgery  will be performed. ? 7.  Thoroughly rinse your body with warm water from the neck down. ? 8.  DO NOT shower/wash with your normal soap after using and rinsing off the CHG Soap. ?               9.  Pat yourself dry with a clean towel. ?           10.  Wear clean pajamas. ?           11.  Place clean sheets on your bed the night of your first shower and do not  sleep with pets. ?Day of Surgery : ?Do not apply any lotions/deodorants  the morning of surgery.  Please wear clean clothes to the hospital/surgery center. ? ?FAILURE TO FOLLOW THESE INSTRUCTIONS MAY RESULT IN THE CANCELLATION OF YOUR SURGERY ? ?PATIENT SIGNATURE_________________________________ ? ?NURSE SIGNATURE__________________________________ ? ?________________________________________________________________________  ?

## 2021-11-02 ENCOUNTER — Encounter (HOSPITAL_COMMUNITY): Payer: Self-pay

## 2021-11-02 ENCOUNTER — Encounter (HOSPITAL_COMMUNITY)
Admission: RE | Admit: 2021-11-02 | Discharge: 2021-11-02 | Disposition: A | Discharge status: Home / Self Care | Payer: BC Managed Care – PPO | Source: Ambulatory Visit | Attending: Surgery | Admitting: Surgery

## 2021-11-02 VITALS — BP 132/92 | HR 80 | Temp 98.5°F | Resp 16 | Ht 61.0 in | Wt 178.0 lb

## 2021-11-02 DIAGNOSIS — Z01812 Encounter for preprocedural laboratory examination: Secondary | ICD-10-CM | POA: Diagnosis not present

## 2021-11-02 DIAGNOSIS — Z01818 Encounter for other preprocedural examination: Secondary | ICD-10-CM

## 2021-11-02 HISTORY — DX: Anxiety disorder, unspecified: F41.9

## 2021-11-02 LAB — CBC
HCT: 43.3 % (ref 36.0–46.0)
Hemoglobin: 14 g/dL (ref 12.0–15.0)
MCH: 28.9 pg (ref 26.0–34.0)
MCHC: 32.3 g/dL (ref 30.0–36.0)
MCV: 89.3 fL (ref 80.0–100.0)
Platelets: 411 10*3/uL — ABNORMAL HIGH (ref 150–400)
RBC: 4.85 MIL/uL (ref 3.87–5.11)
RDW: 13.2 % (ref 11.5–15.5)
WBC: 7.3 10*3/uL (ref 4.0–10.5)
nRBC: 0 % (ref 0.0–0.2)

## 2021-11-02 NOTE — Progress Notes (Signed)
COVID Vaccine Completed: yes x2 ?Date COVID Vaccine completed: ?Has received booster: ?COVID vaccine manufacturer: Pfizer     ? ?Date of COVID positive in last 90 days: no ? ?PCP - Aliene Beams, MD ?Cardiologist - n/a ? ?Chest x-ray - n/a ?EKG - n/a ?Stress Test - n/a ?ECHO - n/a ?Cardiac Cath - n/a ?Pacemaker/ICD device last checked: n/a ?Spinal Cord Stimulator: n/a ? ?Bowel Prep - no ? ?Sleep Study - n/a ?CPAP -  ? ?Fasting Blood Sugar - n/a ?Checks Blood Sugar _____ times a day ? ?Blood Thinner Instructions: n/a ?Aspirin Instructions: ?Last Dose: ? ?Activity level: Can go up a flight of stairs and perform activities of daily living without stopping and without symptoms of chest pain or shortness of breath. ?     ? ?Anesthesia review: BP 141/105, recheck 132/92. Patient has hx of white coat HTN. Patient reports she has normal numbers at home. Denies any symptoms. She will recheck at home and call PCP if still elevated.  ? ?Patient denies shortness of breath, fever, cough and chest pain at PAT appointment ? ? ?Patient verbalized understanding of instructions that were given to them at the PAT appointment. Patient was also instructed that they will need to review over the PAT instructions again at home before surgery.  ?

## 2021-11-09 ENCOUNTER — Encounter (HOSPITAL_COMMUNITY): Payer: Self-pay | Admitting: Surgery

## 2021-11-09 ENCOUNTER — Ambulatory Visit (HOSPITAL_COMMUNITY): Payer: BC Managed Care – PPO | Admitting: Certified Registered Nurse Anesthetist

## 2021-11-09 ENCOUNTER — Other Ambulatory Visit: Payer: Self-pay

## 2021-11-09 ENCOUNTER — Ambulatory Visit (HOSPITAL_COMMUNITY): Payer: BC Managed Care – PPO | Admitting: Physician Assistant

## 2021-11-09 ENCOUNTER — Encounter (HOSPITAL_COMMUNITY)
Admission: RE | Disposition: A | Discharge status: Home / Self Care | Payer: Self-pay | Source: Home / Self Care | Attending: Surgery

## 2021-11-09 ENCOUNTER — Ambulatory Visit (HOSPITAL_COMMUNITY)
Admission: RE | Admit: 2021-11-09 | Discharge: 2021-11-09 | Disposition: A | Discharge status: Home / Self Care | Payer: BC Managed Care – PPO | Attending: Surgery | Admitting: Surgery

## 2021-11-09 DIAGNOSIS — E669 Obesity, unspecified: Secondary | ICD-10-CM | POA: Insufficient documentation

## 2021-11-09 DIAGNOSIS — K819 Cholecystitis, unspecified: Secondary | ICD-10-CM | POA: Diagnosis not present

## 2021-11-09 DIAGNOSIS — K8010 Calculus of gallbladder with chronic cholecystitis without obstruction: Secondary | ICD-10-CM | POA: Diagnosis not present

## 2021-11-09 HISTORY — PX: CHOLECYSTECTOMY: SHX55

## 2021-11-09 LAB — PREGNANCY, URINE: Preg Test, Ur: NEGATIVE

## 2021-11-09 SURGERY — LAPAROSCOPIC CHOLECYSTECTOMY
Anesthesia: General

## 2021-11-09 MED ORDER — LACTATED RINGERS IV SOLN: INTRAVENOUS | Status: DC

## 2021-11-09 MED ORDER — METRONIDAZOLE 500 MG/100ML IV SOLN
500.0000 mg | INTRAVENOUS | Status: AC
  Administered 2021-11-09: 500 mg via INTRAVENOUS
  Filled 2021-11-09: qty 100

## 2021-11-09 MED ORDER — CHLORHEXIDINE GLUCONATE CLOTH 2 % EX PADS: 6.0000 | MEDICATED_PAD | Freq: Once | CUTANEOUS | Status: DC

## 2021-11-09 MED ORDER — ROCURONIUM BROMIDE 10 MG/ML (PF) SYRINGE
PREFILLED_SYRINGE | INTRAVENOUS | Status: DC | PRN
  Administered 2021-11-09: 60 mg via INTRAVENOUS

## 2021-11-09 MED ORDER — LIDOCAINE HCL (PF) 2 % IJ SOLN
INTRAMUSCULAR | Status: AC
  Filled 2021-11-09: qty 5

## 2021-11-09 MED ORDER — LIDOCAINE HCL (CARDIAC) PF 100 MG/5ML IV SOSY
PREFILLED_SYRINGE | INTRAVENOUS | Status: DC | PRN
Start: 2021-11-09 — End: 2021-11-09
  Administered 2021-11-09: 60 mg via INTRAVENOUS

## 2021-11-09 MED ORDER — OXYCODONE HCL 5 MG PO TABS
ORAL_TABLET | ORAL | Status: AC
  Filled 2021-11-09: qty 1

## 2021-11-09 MED ORDER — LACTATED RINGERS IR SOLN
Status: DC | PRN
  Administered 2021-11-09: 1000 mL

## 2021-11-09 MED ORDER — FENTANYL CITRATE (PF) 250 MCG/5ML IJ SOLN
INTRAMUSCULAR | Status: AC
  Filled 2021-11-09: qty 5

## 2021-11-09 MED ORDER — ACETAMINOPHEN 500 MG PO TABS
1000.0000 mg | ORAL_TABLET | ORAL | Status: AC
  Administered 2021-11-09: 1000 mg via ORAL
  Filled 2021-11-09: qty 2

## 2021-11-09 MED ORDER — SUGAMMADEX SODIUM 200 MG/2ML IV SOLN
INTRAVENOUS | Status: DC | PRN
  Administered 2021-11-09: 200 mg via INTRAVENOUS

## 2021-11-09 MED ORDER — BUPIVACAINE-EPINEPHRINE (PF) 0.25% -1:200000 IJ SOLN
INTRAMUSCULAR | Status: AC
  Filled 2021-11-09: qty 30

## 2021-11-09 MED ORDER — OXYCODONE HCL 5 MG/5ML PO SOLN: 5.0000 mg | Freq: Once | ORAL | Status: AC | PRN

## 2021-11-09 MED ORDER — DEXAMETHASONE SODIUM PHOSPHATE 10 MG/ML IJ SOLN
INTRAMUSCULAR | Status: DC | PRN
  Administered 2021-11-09: 5 mg via INTRAVENOUS

## 2021-11-09 MED ORDER — OXYCODONE HCL 5 MG PO TABS
5.0000 mg | ORAL_TABLET | Freq: Once | ORAL | Status: AC | PRN
  Administered 2021-11-09: 5 mg via ORAL

## 2021-11-09 MED ORDER — BUPIVACAINE-EPINEPHRINE 0.25% -1:200000 IJ SOLN
INTRAMUSCULAR | Status: DC | PRN
Start: 2021-11-09 — End: 2021-11-10
  Administered 2021-11-09: 20 mL

## 2021-11-09 MED ORDER — HYDROMORPHONE HCL 1 MG/ML IJ SOLN
INTRAMUSCULAR | Status: AC
  Filled 2021-11-09: qty 1

## 2021-11-09 MED ORDER — CHLORHEXIDINE GLUCONATE 0.12 % MT SOLN
15.0000 mL | Freq: Once | OROMUCOSAL | Status: AC
  Administered 2021-11-09: 15 mL via OROMUCOSAL

## 2021-11-09 MED ORDER — PROPOFOL 10 MG/ML IV BOLUS
INTRAVENOUS | Status: DC | PRN
  Administered 2021-11-09: 200 mg via INTRAVENOUS

## 2021-11-09 MED ORDER — PHENYLEPHRINE 80 MCG/ML (10ML) SYRINGE FOR IV PUSH (FOR BLOOD PRESSURE SUPPORT)
PREFILLED_SYRINGE | INTRAVENOUS | Status: DC | PRN
  Administered 2021-11-09: 80 ug via INTRAVENOUS

## 2021-11-09 MED ORDER — MEPERIDINE HCL 50 MG/ML IJ SOLN: 6.2500 mg | INTRAMUSCULAR | Status: DC | PRN

## 2021-11-09 MED ORDER — HYDROMORPHONE HCL 1 MG/ML IJ SOLN
0.2500 mg | INTRAMUSCULAR | Status: DC | PRN
  Administered 2021-11-09 (×2): 0.5 mg via INTRAVENOUS

## 2021-11-09 MED ORDER — PROPOFOL 10 MG/ML IV BOLUS
INTRAVENOUS | Status: AC
  Filled 2021-11-09: qty 20

## 2021-11-09 MED ORDER — ONDANSETRON HCL 4 MG/2ML IJ SOLN
INTRAMUSCULAR | Status: DC | PRN
  Administered 2021-11-09: 4 mg via INTRAVENOUS

## 2021-11-09 MED ORDER — ROCURONIUM BROMIDE 10 MG/ML (PF) SYRINGE
PREFILLED_SYRINGE | INTRAVENOUS | Status: AC
  Filled 2021-11-09: qty 10

## 2021-11-09 MED ORDER — OXYCODONE-ACETAMINOPHEN 5-325 MG PO TABS: 1.0000 | ORAL_TABLET | ORAL | 0 refills | Status: AC | PRN

## 2021-11-09 MED ORDER — MIDAZOLAM HCL 2 MG/2ML IJ SOLN
INTRAMUSCULAR | Status: DC | PRN
  Administered 2021-11-09: 2 mg via INTRAVENOUS

## 2021-11-09 MED ORDER — FENTANYL CITRATE (PF) 250 MCG/5ML IJ SOLN
INTRAMUSCULAR | Status: DC | PRN
  Administered 2021-11-09: 50 ug via INTRAVENOUS
  Administered 2021-11-09 (×2): 100 ug via INTRAVENOUS

## 2021-11-09 MED ORDER — MIDAZOLAM HCL 2 MG/2ML IJ SOLN
INTRAMUSCULAR | Status: AC
  Filled 2021-11-09: qty 2

## 2021-11-09 MED ORDER — CIPROFLOXACIN IN D5W 400 MG/200ML IV SOLN
400.0000 mg | INTRAVENOUS | Status: AC
  Administered 2021-11-09: 400 mg via INTRAVENOUS
  Filled 2021-11-09: qty 200

## 2021-11-09 MED ORDER — KETOROLAC TROMETHAMINE 15 MG/ML IJ SOLN
15.0000 mg | INTRAMUSCULAR | Status: AC
  Administered 2021-11-09: 15 mg via INTRAVENOUS
  Filled 2021-11-09: qty 1

## 2021-11-09 MED ORDER — ORAL CARE MOUTH RINSE: 15.0000 mL | Freq: Once | OROMUCOSAL | Status: AC

## 2021-11-09 MED ORDER — 0.9 % SODIUM CHLORIDE (POUR BTL) OPTIME
TOPICAL | Status: DC | PRN
  Administered 2021-11-09: 1000 mL

## 2021-11-09 MED ORDER — MIDAZOLAM HCL 2 MG/2ML IJ SOLN: 0.5000 mg | Freq: Once | INTRAMUSCULAR | Status: DC | PRN

## 2021-11-09 SURGICAL SUPPLY — 45 items
APPLIER CLIP ROT 10 11.4 M/L (STAPLE) ×2
BAG COUNTER SPONGE SURGICOUNT (BAG) IMPLANT
CABLE HIGH FREQUENCY MONO STRZ (ELECTRODE) ×2 IMPLANT
CATH URETL OPEN 5X70 (CATHETERS) IMPLANT
CHLORAPREP W/TINT 26 (MISCELLANEOUS) ×2 IMPLANT
CLIP APPLIE ROT 10 11.4 M/L (STAPLE) ×1 IMPLANT
COVER MAYO STAND XLG (MISCELLANEOUS) ×2 IMPLANT
COVER SURGICAL LIGHT HANDLE (MISCELLANEOUS) ×2 IMPLANT
CUTTER FLEX LINEAR 45M (STAPLE) ×1 IMPLANT
DERMABOND ADVANCED (GAUZE/BANDAGES/DRESSINGS) ×1
DERMABOND ADVANCED .7 DNX12 (GAUZE/BANDAGES/DRESSINGS) ×1 IMPLANT
DRAPE C-ARM 42X120 X-RAY (DRAPES) IMPLANT
ELECT REM PT RETURN 15FT ADLT (MISCELLANEOUS) ×2 IMPLANT
ENDOLOOP SUT PDS II  0 18 (SUTURE) ×1
ENDOLOOP SUT PDS II 0 18 (SUTURE) ×1 IMPLANT
GLOVE BIO SURGEON STRL SZ7.5 (GLOVE) ×2 IMPLANT
GLOVE BIOGEL PI IND STRL 8 (GLOVE) ×1 IMPLANT
GLOVE BIOGEL PI INDICATOR 8 (GLOVE) ×1
GOWN STRL REUS W/ TWL XL LVL3 (GOWN DISPOSABLE) ×2 IMPLANT
GOWN STRL REUS W/TWL XL LVL3 (GOWN DISPOSABLE) ×2
GRASPER SUT TROCAR 14GX15 (MISCELLANEOUS) ×1 IMPLANT
HEMOSTAT SNOW SURGICEL 2X4 (HEMOSTASIS) ×1 IMPLANT
IRRIG SUCT STRYKERFLOW 2 WTIP (MISCELLANEOUS) ×2
IRRIGATION SUCT STRKRFLW 2 WTP (MISCELLANEOUS) ×1 IMPLANT
IV CATH 14GX2 1/4 (CATHETERS) ×2 IMPLANT
KIT BASIN OR (CUSTOM PROCEDURE TRAY) ×2 IMPLANT
KIT TURNOVER KIT A (KITS) IMPLANT
NDL INSUFFLATION 14GA 120MM (NEEDLE) ×1 IMPLANT
NEEDLE INSUFFLATION 14GA 120MM (NEEDLE) ×2 IMPLANT
PENCIL SMOKE EVACUATOR (MISCELLANEOUS) IMPLANT
POUCH RETRIEVAL ECOSAC 10 (ENDOMECHANICALS) ×1 IMPLANT
POUCH RETRIEVAL ECOSAC 10MM (ENDOMECHANICALS) ×1
RELOAD 45 VASCULAR/THIN (ENDOMECHANICALS) ×2 IMPLANT
RELOAD STAPLE 45 2.5 WHT GRN (ENDOMECHANICALS) IMPLANT
SCISSORS LAP 5X35 DISP (ENDOMECHANICALS) ×2 IMPLANT
SET TUBE SMOKE EVAC HIGH FLOW (TUBING) ×2 IMPLANT
SLEEVE Z-THREAD 5X100MM (TROCAR) ×4 IMPLANT
SPIKE FLUID TRANSFER (MISCELLANEOUS) ×2 IMPLANT
STOPCOCK 4 WAY LG BORE MALE ST (IV SETS) IMPLANT
SUT MNCRL AB 4-0 PS2 18 (SUTURE) ×2 IMPLANT
TOWEL OR 17X26 10 PK STRL BLUE (TOWEL DISPOSABLE) ×2 IMPLANT
TOWEL OR NON WOVEN STRL DISP B (DISPOSABLE) IMPLANT
TRAY LAPAROSCOPIC (CUSTOM PROCEDURE TRAY) ×2 IMPLANT
TROCAR BLADELESS OPT 5 100 (ENDOMECHANICALS) ×2 IMPLANT
TROCAR KII 12X100 BLADELESS (ENDOMECHANICALS) ×2 IMPLANT

## 2021-11-09 NOTE — Op Note (Signed)
? ?Patient: Alexandra George (10-Jan-1986, [id number]) ? ?Date of Surgery: 11/09/2021  ? ?Preoperative Diagnosis: CHOLECYSTITIS  ? ?Postoperative Diagnosis: CHOLECYSTITIS  ? ?Surgical Procedure: LAPAROSCOPIC CHOLECYSTECTOMY:   ? ?Operative Team Members:  ?Surgeon(s) and Role: ?   * Ronetta Molla, Hyman Hopes, MD - Primary  ? ?Anesthesiologist: Jairo Ben, MD ?CRNA: Yolonda Kida, CRNA  ? ?Anesthesia: General  ? ?Fluids:  ?Total I/O ?In: 900 [I.V.:600; IV Piggyback:300] ?Out: 10 [Blood:10] ? ?Complications: None ? ?Drains:  none  ? ?Specimen:  ?ID Type Source Tests Collected by Time Destination  ?1 : gallbladder Tissue PATH Other SURGICAL PATHOLOGY Analeigha Nauman, Hyman Hopes, MD 11/09/2021 1724   ?  ? ?Disposition:  PACU - hemodynamically stable. ? ?Plan of Care: Discharge to home after PACU ? ? ? ?Indications for Procedure: Kristine Chahal is a 36 y.o. female who presented with abdominal pain.  History, physical and imaging was concerning for cholecystitis.  Laparoscopic cholecystectomy was recommended for the patient.  The procedure itself, as well as the risks, benefits and alternatives were discussed with the patient.  Risks discussed included but were not limited to the risk of infection, bleeding, damage to nearby structures, need to convert to open procedure, incisional hernia, bile leak, common bile duct injury and the need for additional procedures or surgeries.  With this discussion complete and all questions answered the patient granted consent to proceed. ? ?Findings: Shrunken inflamed gallbladder with two stones casting the entire inside of the gallbladder. ? ?Infection status: ?Patient: Private Patient Elective Case ?Case: Elective ?Infection Present At Time Of Surgery (PATOS):  Some spillage of bile during the case. ? ? ?Description of Procedure:  ? ?On the date stated above, the patient was taken to the operating room suite and placed in supine positioning.  Sequential compression devices were placed on the  lower extremities to prevent blood clots.  General endotracheal anesthesia was induced. Preoperative antibiotics were given within 30 minutes of incision.  The patient's abdomen was prepped and draped in the usual sterile fashion.  A time-out was completed verifying the correct patient, procedure, positioning and equipment needed for the case. ? ?We began by anesthetizing the skin with local anesthetic and then making a 5 mm incision just below the umbilicus.  We dissected through the subcutaneous tissues to the fascia.  The fascia was grasped and elevated using a Kocher clamp.  A Veress needle was inserted into the abdomen and the abdomen was insufflated to 15 mmHg.  A 5 mm trocar was inserted in this position under optical guidance and then the abdomen was inspected.  There was no trauma to the underlying viscera with initial trocar placement.  Any abnormal findings, other than inflammation in the right upper quadrant, are listed above in the findings section.  Three additional trocars were placed, one 12 mm trocar in the subxiphoid position, one 5 mm trocar in the midline epigastric area and one 51mm trocar in the right upper quadrant subcostally.  These were placed under direct vision without any trauma to the underlying viscera.   ? ?The patient was then placed in head up, left side down positioning.  The gallbladder was identified and dissected free from its attachments to the omentum allowing the duodenum to fall away.  The infundibulum of the gallbladder was dissected free working laterally to medially.  The cystic duct and cystic artery were dissected free from surrounding connective tissue.  The infundibulum of the gallbladder was dissected off the cystic plate.  There was severe inflammation around  the shrunken gallbladder.  Dissection was very slow and meticulous.  The gallbladder body and infundibulum was able to be dissected free of the liver bed.  I was concerned about continuing this difficult  dissection towards the common bile duct so I fired a white load of the linear stapler across the infundibulum of the gallbladder.  The gallbladder was dissected off the cystic plate, placed in an endocatch bag and removed from the 12 mm subxiphoid port site.  On palpation of the specimen, the gallbladder had two stones which basically cast the entire lumen of  the gallbladder.  The cystic plate was inspected and hemostasis was obtained using electrocautery.  A piece of snow topical hemostatic agent was placed in the gallbladder fossa.  Attention was turned to closure.  The 12 mm subxiphoid port site was closed using a 0-vicryl suture on a fascial suture passer.  The abdomen was desufflated.  The skin was closed using 4-0 monocryl and dermabond.  All sponge and needle counts were correct at the conclusion of the case. ? ? ? ?Ivar Drape, MD ?General, Bariatric, & Minimally Invasive Surgery ?[facility], [place] ? ?

## 2021-11-09 NOTE — Anesthesia Procedure Notes (Signed)
Procedure Name: Intubation ?Date/Time: 11/09/2021 4:37 PM ?Performed by: Raenette Rover, CRNA ?Pre-anesthesia Checklist: Patient identified, Emergency Drugs available, Suction available and Patient being monitored ?Patient Re-evaluated:Patient Re-evaluated prior to induction ?Oxygen Delivery Method: Circle system utilized ?Preoxygenation: Pre-oxygenation with 100% oxygen ?Induction Type: IV induction ?Ventilation: Mask ventilation without difficulty ?Laryngoscope Size: Mac and 3 ?Grade View: Grade I ?Tube type: Oral ?Tube size: 7.0 mm ?Number of attempts: 1 ?Airway Equipment and Method: Stylet ?Placement Confirmation: ETT inserted through vocal cords under direct vision, positive ETCO2 and breath sounds checked- equal and bilateral ?Secured at: 22 cm ?Tube secured with: Tape ?Dental Injury: Teeth and Oropharynx as per pre-operative assessment  ? ? ? ? ?

## 2021-11-09 NOTE — H&P (Signed)
? ?  Admitting Physician: Hyman Hopes Myquan Schaumburg ? ?Service: General surgery ? ?CC: Cholecystitis ? ?Subjective  ? ?HPI: ?Alexandra George is an 36 y.o. female who is here for cholecystectomy. ? ?Past Medical History:  ?Diagnosis Date  ? Anxiety   ? Hypertension   ? over the last week of pregnancy  ? Kidney stones   ? ? ?Past Surgical History:  ?Procedure Laterality Date  ? eardrum     ? TONSILLECTOMY    ? ? ?Family History  ?Problem Relation Age of Onset  ? Hypertension Mother   ? Cancer Mother   ? Diabetes Father   ? Hypertension Father   ? ? ?Social:  reports that she has never smoked. She has never used smokeless tobacco. She reports current alcohol use. She reports that she does not use drugs. ? ?Allergies:  ?Allergies  ?Allergen Reactions  ? Penicillins Rash  ? ? ?Medications: ?Current Outpatient Medications  ?Medication Instructions  ? cetirizine (ZYRTEC) 10 mg, Oral, Daily PRN  ? dicyclomine (BENTYL) 20 mg, Oral, 3 times daily PRN  ? ibuprofen (ADVIL) 200-400 mg, Oral, Every 8 hours PRN  ? Multiple Vitamin (MULTIVITAMIN WITH MINERALS) TABS tablet 1 tablet, Oral, Daily, Women's Multivitamin  ? ? ?ROS - all of the below systems have been reviewed with the patient and positives are indicated with bold text ?General: chills, fever or night sweats ?Eyes: blurry vision or double vision ?ENT: epistaxis or sore throat ?Allergy/Immunology: itchy/watery eyes or nasal congestion ?Hematologic/Lymphatic: bleeding problems, blood clots or swollen lymph nodes ?Endocrine: temperature intolerance or unexpected weight changes ?Breast: new or changing breast lumps or nipple discharge ?Resp: cough, shortness of breath, or wheezing ?CV: chest pain or dyspnea on exertion ?GI: as per HPI ?GU: dysuria, trouble voiding, or hematuria ?MSK: joint pain or joint stiffness ?Neuro: TIA or stroke symptoms ?Derm: pruritus and skin lesion changes ?Psych: anxiety and depression ? ?Objective  ? ?PE ?Blood pressure (!) 140/98, pulse 77, temperature  99 ?F (37.2 ?C), temperature source Oral, resp. rate 16, height 5\' 1"  (1.549 m), weight 80.7 kg, last menstrual period 10/25/2021, SpO2 99 %, unknown if currently breastfeeding. ?Constitutional: NAD; conversant; no deformities ?Eyes: Moist conjunctiva; no lid lag; anicteric; PERRL ?Neck: Trachea midline; no thyromegaly ?Lungs: Normal respiratory effort; no tactile fremitus ?CV: RRR; no palpable thrills; no pitting edema ?GI: Abd Soft, nontender; no palpable hepatosplenomegaly ?MSK: Normal range of motion of extremities; no clubbing/cyanosis ?Psychiatric: Appropriate affect; alert and oriented x3 ?Lymphatic: No palpable cervical or axillary lymphadenopathy ? ?Results for orders placed or performed during the hospital encounter of 11/09/21 (from the past 24 hour(s))  ?Pregnancy, urine     Status: None  ? Collection Time: 11/09/21  1:09 PM  ?Result Value Ref Range  ? Preg Test, Ur NEGATIVE NEGATIVE  ? ? ?Imaging Orders  ?No imaging studies ordered today  ?Labs, Imaging and Diagnostic Testing: ?RUQ 11/11/21 09/09/21: ?1. Probable gallstone-filled gallbladder (wall echo shadow sign). No ?evidence of cholecystitis or biliary dilatation. ?2. Echogenic liver, likely due to steatosis. ?CBD 40mm ? ? ? ?Assessment and Plan  ? ? ? ?Diagnoses and all orders for this visit: ? ?Calculus of gallbladder with chronic cholecystitis without obstruction ? ?Ms. Parisi has gallstones and symptoms consistent with biliary colic. I recommended laparoscopic cholecystectomy. The procedure, its risks, benefits and alteratives were described and the patient granted consent to proceed.  ? ? ?Quintella Baton, MD ? ?[facility] ?Use [email or web address] to contact on call provider ? ? ? ?

## 2021-11-09 NOTE — Discharge Instructions (Signed)
 CHOLECYSTECTOMY POST OPERATIVE INSTRUCTIONS  Thinking Clearly  The anesthesia may cause you to feel different for 1 or 2 days. Do not drive, drink alcohol, or make any big decisions for at least 2 days.  Nutrition When you wake up, you will be able to drink small amounts of liquid. If you do not feel sick, you can slowly advance your diet to regular foods. Continue to drink lots of fluids, usually about 8 to 10 glasses per day. Eat a high-fiber diet so you don't strain during bowel movements. High-Fiber Foods Foods high in fiber include beans, bran cereals and whole-grain breads, peas, dried fruit (figs, apricots, and dates), raspberries, blackberries, strawberries, sweet corn, broccoli, baked potatoes with skin, plums, pears, apples, greens, and nuts. Activity Slowly increase your activity. Be sure to get up and walk every hour or so to prevent blood clots. No heavy lifting or strenuous activity for 4 weeks following surgery to prevent hernias at your incision sites It is normal to feel tired. You may need more sleep than usual.  Get your rest but make sure to get up and move around frequently to prevent blood clots and pneumonia.  Work and Return to School You can go back to work when you feel well enough. Discuss the timing with your surgeon. You can usually go back to school or work 1 week after an operation. If your work requires heavy lifting or strenuous activity you need to be placed on light duty for 4 weeks following surgery. You can return to gym class, sports or other physical activities 4 weeks after surgery.  Wound Care Always wash your hands before and after touching near your incision site. Do not soak in a bathtub until cleared at your follow up appointment. You may take a shower 24 hours after surgery. A small amount of drainage from the incision is normal. If the drainage is thick and yellow or the site is red, you may have an infection, so call your surgeon. If you  have a drain in one of your incisions, it will be taken out in office when the drainage stops. Steri-Strips will fall off in 7 to 10 days or they will be removed during your first office visit. If you have dermabond glue covering over the incision, allow the glue to flake off on its own. Avoid wearing tight or rough clothing. It may rub your incisions and make it harder for them to heal. Protect the new skin, especially from the sun. The sun can burn and cause darker scarring. Your scar will heal in about 4 to 6 weeks and will become softer and continue to fade over the next year.  The cosmetic appearance of the incisions will improve over the course of the first year after surgery. Sensation around your incision will return in a few weeks or months.  Bowel Movements After intestinal surgery, you may have loose watery stools for several days. If watery diarrhea lasts longer than 3 days, contact your surgeon. Pain medication (narcotics) can cause constipation. Increase the fiber in your diet with high-fiber foods if you are constipated. You can take an over the counter stool softener like Colace to avoid constipation.  Additional over the counter medications can also be used if Colace isn't sufficient (for example, Milk of Magnesia or Miralax).  Pain The amount of pain is different for each person. Some people need only 1 to 3 doses of pain control medication, while others need more. Take alternating doses of tylenol   and ibuprofen around the clock for the first five days following surgery.  This will provide a baseline of pain control and help with inflammation.  Take the narcotic pain medication in addition if needed for severe pain.  Contact Your Surgeon at [phone], if you have: Pain in your right upper abdomen like a gallbladder attack. Pain that will not go away Pain that gets worse A fever of more than 101F (38.3C) Repeated vomiting Swelling, redness, bleeding, or bad-smelling  drainage from your wound site Strong abdominal pain No bowel movement or unable to pass gas for 3 days Watery diarrhea lasting longer than 3 days  Pain Control The goal of pain control is to minimize pain, keep you moving and help you heal. Your surgical team will work with you on your pain plan. Most often a combination of therapies and medications are used to control your pain. You may also be given medication (local anesthetic) at the surgical site. This may help control your pain for several days. Extreme pain puts extra stress on your body at a time when your body needs to focus on healing. Do not wait until your pain has reached a level "10" or is unbearable before telling your doctor or nurse. It is much easier to control pain before it becomes severe. Following a laparoscopic procedure, pain is sometimes felt in the shoulder. This is due to the gas inserted into your abdomen during the procedure. Moving and walking helps to decrease the gas and the right shoulder pain.  Use the guide below for ways to manage your post-operative pain. Learn more by going to facs.org/safepaincontrol.  How Intense Is My Pain Common Therapies to Feel Better       I hardly notice my pain, and it does not interfere with my activities.  I notice my pain and it distracts me, but I can still do activities (sitting up, walking, standing).  Non-Medication Therapies  Ice (in a bag, applied over clothing at the surgical site), elevation, rest, meditation, massage, distraction (music, TV, play) walking and mild exercise Splinting the abdomen with pillows +  Non-Opioid Medications Acetaminophen (Tylenol) Non-steroidal anti-inflammatory drugs (NSAIDS) Aspirin, Ibuprofen (Motrin, Advil) Naproxen (Aleve) Take these as needed, when you feel pain. Both acetaminophen and NSAIDs help to decrease pain and swelling (inflammation).      My pain is hard to ignore and is more noticeable even when I rest.  My  pain interferes with my usual activities.  Non-Medication Therapies  +  Non-Opioid medications  Take on a regular schedule (around-the-clock) instead of as needed. (For example, Tylenol every 6 hours at 9:00 am, 3:00 pm, 9:00 pm, 3:00 am and Motrin every 6 hours at 12:00 am, 6:00 am, 12:00 pm, 6:00 pm)         I am focused on my pain, and I am not doing my daily activities.  I am groaning in pain, and I cannot sleep. I am unable to do anything.  My pain is as bad as it could be, and nothing else matters.  Non-Medication Therapies  +  Around-the-Clock Non-Opioid Medications  +  Short-acting opioids  Opioids should be used with other medications to manage severe pain. Opioids block pain and give a feeling of euphoria (feel high). Addiction, a serious side effect of opioids, is rare with short-term (a few days) use.  Examples of short-acting opioids include: Tramadol (Ultram), Hydrocodone (Norco, Vicodin), Hydromorphone (Dilaudid), Oxycodone (Oxycontin)     The above directions have been adapted from   the American College [employer] Surgical Patient Education Program.  Please refer to the ACS website if needed: https://www.facs.org/-/media/files/education/patient-ed/cholesys.ashx.   Alexandra Fleig, MD [facility] [address], [address], [place], [place]  [address] ?  [address] [address], [place], [place]   [address] [phone] ? [phone] ? FAX [phone] Web site: [email or web address]  

## 2021-11-09 NOTE — Transfer of Care (Signed)
Immediate Anesthesia Transfer of Care Note ? ?Patient: Alexandra George ? ?Procedure(s) Performed: LAPAROSCOPIC CHOLECYSTECTOMY ? ?Patient Location: PACU ? ?Anesthesia Type:General ? ?Level of Consciousness: awake, drowsy and patient cooperative ? ?Airway & Oxygen Therapy: Patient Spontanous Breathing and Patient connected to face mask oxygen ? ?Post-op Assessment: Report given to RN and Post -op Vital signs reviewed and stable ? ?Post vital signs: Reviewed and stable ? ?Last Vitals:  ?Vitals Value Taken Time  ?BP 140/91 11/09/21 1750  ?Temp    ?Pulse 99 11/09/21 1753  ?Resp 13 11/09/21 1753  ?SpO2 100 % 11/09/21 1753  ?Vitals shown include unvalidated device data. ? ?Last Pain:  ?Vitals:  ? 11/09/21 1328  ?TempSrc:   ?PainSc: 0-No pain  ?   ? ?Patients Stated Pain Goal: 3 (11/09/21 1328) ? ?Complications: No notable events documented. ?

## 2021-11-09 NOTE — Anesthesia Preprocedure Evaluation (Addendum)
Anesthesia Evaluation  ?Patient identified by MRN, date of birth, ID band ?Patient awake ? ? ? ?Reviewed: ?Allergy & Precautions, NPO status , Patient's Chart, lab work & pertinent test results ? ?History of Anesthesia Complications ?Negative for: history of anesthetic complications ? ?Airway ?Mallampati: II ? ?TM Distance: >3 FB ?Neck ROM: Full ? ? ? Dental ? ?(+) Chipped, Dental Advisory Given ?  ?Pulmonary ?neg pulmonary ROS,  ?  ?breath sounds clear to auscultation ? ? ? ? ? ? Cardiovascular ?(-) hypertension(-) anginanegative cardio ROS ? ? ?Rhythm:Regular Rate:Normal ? ? ?  ?Neuro/Psych ?negative neurological ROS ?   ? GI/Hepatic ?Neg liver ROS, cholecystitis ?  ?Endo/Other  ?obese ? Renal/GU ?H/o stones  ? ?  ?Musculoskeletal ? ? Abdominal ?(+) + obese,   ?Peds ? Hematology ?negative hematology ROS ?(+)   ?Anesthesia Other Findings ? ? Reproductive/Obstetrics ? ?  ? ? ? ? ? ? ? ? ? ? ? ? ? ?  ?  ? ? ? ? ? ? ? ?Anesthesia Physical ?Anesthesia Plan ? ?ASA: 2 ? ?Anesthesia Plan: General  ? ?Post-op Pain Management: Tylenol PO (pre-op)*  ? ?Induction:  ? ?PONV Risk Score and Plan: 3 and Ondansetron, Dexamethasone and Treatment may vary due to age or medical condition ? ?Airway Management Planned: Oral ETT ? ?Additional Equipment: None ? ?Intra-op Plan:  ? ?Post-operative Plan: Extubation in OR ? ?Informed Consent: I have reviewed the patients History and Physical, chart, labs and discussed the procedure including the risks, benefits and alternatives for the proposed anesthesia with the patient or authorized representative who has indicated his/her understanding and acceptance.  ? ? ? ?Dental advisory given ? ?Plan Discussed with: CRNA and Surgeon ? ?Anesthesia Plan Comments:   ? ? ? ? ? ?Anesthesia Quick Evaluation ? ?

## 2021-11-09 NOTE — Anesthesia Postprocedure Evaluation (Signed)
Anesthesia Post Note ? ?Patient: Alexandra George ? ?Procedure(s) Performed: LAPAROSCOPIC CHOLECYSTECTOMY ? ?  ? ?Patient location during evaluation: Phase II ?Anesthesia Type: General ?Level of consciousness: awake and alert, patient cooperative and oriented ?Pain management: pain level controlled ?Vital Signs Assessment: post-procedure vital signs reviewed and stable ?Respiratory status: spontaneous breathing, nonlabored ventilation and respiratory function stable ?Cardiovascular status: blood pressure returned to baseline and stable ?Postop Assessment: no apparent nausea or vomiting and able to ambulate ?Anesthetic complications: no ? ? ?No notable events documented. ? ?Last Vitals:  ?Vitals:  ? 11/09/21 1842 11/09/21 1846  ?BP:  (!) 129/95  ?Pulse:  61  ?Resp:  20  ?Temp: 36.6 ?C 36.6 ?C  ?SpO2:  97%  ?  ?Last Pain:  ?Vitals:  ? 11/09/21 1846  ?TempSrc: Oral  ?PainSc: 3   ? ? ?  ?  ?  ?  ?  ?  ? ?Datra Clary,E. Doshia Dalia ? ? ? ? ?

## 2021-11-10 ENCOUNTER — Encounter (HOSPITAL_COMMUNITY): Payer: Self-pay | Admitting: Surgery

## 2021-11-11 LAB — SURGICAL PATHOLOGY

## 2022-01-12 DIAGNOSIS — R197 Diarrhea, unspecified: Secondary | ICD-10-CM | POA: Diagnosis not present

## 2022-03-07 DIAGNOSIS — R194 Change in bowel habit: Secondary | ICD-10-CM | POA: Diagnosis not present

## 2022-03-07 DIAGNOSIS — K580 Irritable bowel syndrome with diarrhea: Secondary | ICD-10-CM | POA: Diagnosis not present

## 2022-03-07 DIAGNOSIS — F419 Anxiety disorder, unspecified: Secondary | ICD-10-CM | POA: Diagnosis not present

## 2022-03-07 DIAGNOSIS — E669 Obesity, unspecified: Secondary | ICD-10-CM | POA: Diagnosis not present

## 2022-03-07 DIAGNOSIS — K9089 Other intestinal malabsorption: Secondary | ICD-10-CM | POA: Diagnosis not present

## 2022-03-23 DIAGNOSIS — K9089 Other intestinal malabsorption: Secondary | ICD-10-CM | POA: Diagnosis not present

## 2022-03-23 DIAGNOSIS — K580 Irritable bowel syndrome with diarrhea: Secondary | ICD-10-CM | POA: Diagnosis not present

## 2022-03-23 DIAGNOSIS — R194 Change in bowel habit: Secondary | ICD-10-CM | POA: Diagnosis not present

## 2022-03-23 DIAGNOSIS — R197 Diarrhea, unspecified: Secondary | ICD-10-CM | POA: Diagnosis not present

## 2022-06-08 DIAGNOSIS — E669 Obesity, unspecified: Secondary | ICD-10-CM | POA: Diagnosis not present

## 2022-06-08 DIAGNOSIS — K580 Irritable bowel syndrome with diarrhea: Secondary | ICD-10-CM | POA: Diagnosis not present

## 2022-06-08 DIAGNOSIS — R1013 Epigastric pain: Secondary | ICD-10-CM | POA: Diagnosis not present

## 2022-06-08 DIAGNOSIS — K9089 Other intestinal malabsorption: Secondary | ICD-10-CM | POA: Diagnosis not present

## 2022-07-17 DIAGNOSIS — Z6833 Body mass index (BMI) 33.0-33.9, adult: Secondary | ICD-10-CM | POA: Diagnosis not present

## 2022-07-17 DIAGNOSIS — Z124 Encounter for screening for malignant neoplasm of cervix: Secondary | ICD-10-CM | POA: Diagnosis not present

## 2022-07-17 DIAGNOSIS — Z01419 Encounter for gynecological examination (general) (routine) without abnormal findings: Secondary | ICD-10-CM | POA: Diagnosis not present

## 2022-10-10 DIAGNOSIS — E669 Obesity, unspecified: Secondary | ICD-10-CM | POA: Diagnosis not present

## 2022-10-10 DIAGNOSIS — F419 Anxiety disorder, unspecified: Secondary | ICD-10-CM | POA: Diagnosis not present

## 2022-10-10 DIAGNOSIS — K9089 Other intestinal malabsorption: Secondary | ICD-10-CM | POA: Diagnosis not present

## 2022-10-10 DIAGNOSIS — K580 Irritable bowel syndrome with diarrhea: Secondary | ICD-10-CM | POA: Diagnosis not present

## 2023-02-28 DIAGNOSIS — R030 Elevated blood-pressure reading, without diagnosis of hypertension: Secondary | ICD-10-CM | POA: Diagnosis not present

## 2023-02-28 DIAGNOSIS — H9202 Otalgia, left ear: Secondary | ICD-10-CM | POA: Diagnosis not present

## 2023-03-15 DIAGNOSIS — K602 Anal fissure, unspecified: Secondary | ICD-10-CM | POA: Diagnosis not present

## 2023-03-15 DIAGNOSIS — E669 Obesity, unspecified: Secondary | ICD-10-CM | POA: Diagnosis not present

## 2023-03-15 DIAGNOSIS — K9089 Other intestinal malabsorption: Secondary | ICD-10-CM | POA: Diagnosis not present

## 2023-07-18 DIAGNOSIS — Z0000 Encounter for general adult medical examination without abnormal findings: Secondary | ICD-10-CM | POA: Diagnosis not present

## 2023-07-18 DIAGNOSIS — Z23 Encounter for immunization: Secondary | ICD-10-CM | POA: Diagnosis not present

## 2023-08-02 DIAGNOSIS — Z1159 Encounter for screening for other viral diseases: Secondary | ICD-10-CM | POA: Diagnosis not present

## 2023-08-02 DIAGNOSIS — Z0000 Encounter for general adult medical examination without abnormal findings: Secondary | ICD-10-CM | POA: Diagnosis not present

## 2023-08-02 DIAGNOSIS — Z13220 Encounter for screening for lipoid disorders: Secondary | ICD-10-CM | POA: Diagnosis not present

## 2023-08-14 ENCOUNTER — Other Ambulatory Visit: Payer: Self-pay | Admitting: Gastroenterology

## 2023-08-14 DIAGNOSIS — R1310 Dysphagia, unspecified: Secondary | ICD-10-CM | POA: Diagnosis not present

## 2023-08-14 DIAGNOSIS — K625 Hemorrhage of anus and rectum: Secondary | ICD-10-CM | POA: Diagnosis not present

## 2023-08-14 DIAGNOSIS — K9089 Other intestinal malabsorption: Secondary | ICD-10-CM | POA: Diagnosis not present

## 2023-08-14 DIAGNOSIS — Z1211 Encounter for screening for malignant neoplasm of colon: Secondary | ICD-10-CM | POA: Diagnosis not present

## 2023-08-16 ENCOUNTER — Other Ambulatory Visit: Payer: BC Managed Care – PPO

## 2023-09-03 DIAGNOSIS — R194 Change in bowel habit: Secondary | ICD-10-CM | POA: Diagnosis not present

## 2023-09-03 DIAGNOSIS — K625 Hemorrhage of anus and rectum: Secondary | ICD-10-CM | POA: Diagnosis not present

## 2023-09-03 DIAGNOSIS — K648 Other hemorrhoids: Secondary | ICD-10-CM | POA: Diagnosis not present

## 2023-09-04 IMAGING — US US ABDOMEN LIMITED
1 series · 14 of 25 positions shown · non-contrast
Comparison: Renal ultrasound 02/22/2020

CLINICAL DATA: Epigastric abdominal pain with cramping.

EXAM:
ULTRASOUND ABDOMEN LIMITED RIGHT UPPER QUADRANT

[Series 1: us abdomen limited · 0.28mm/px · 14 of 41 slices shown]
[im 1/41]
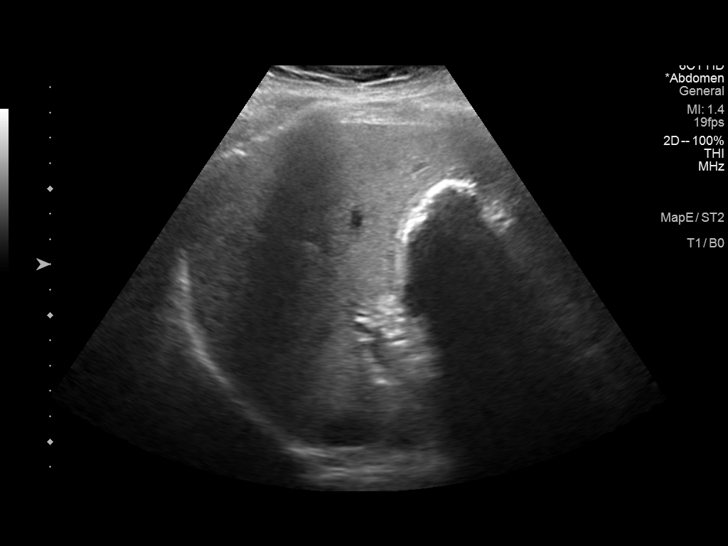
[im 4/41]
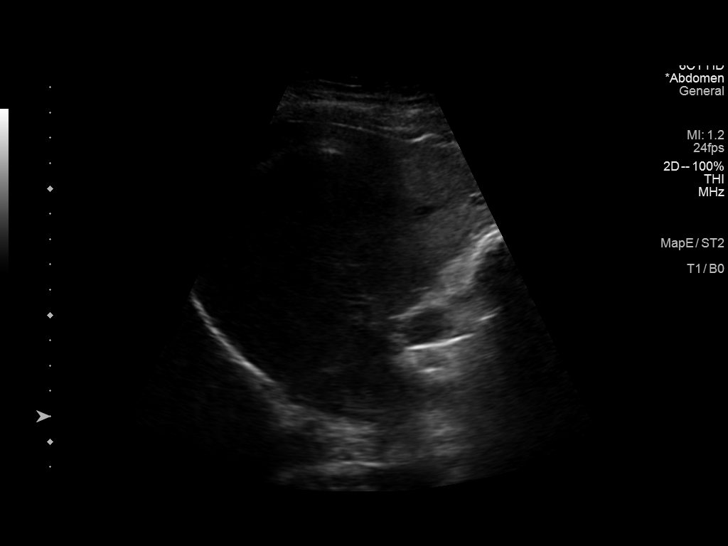
[im 7/41]
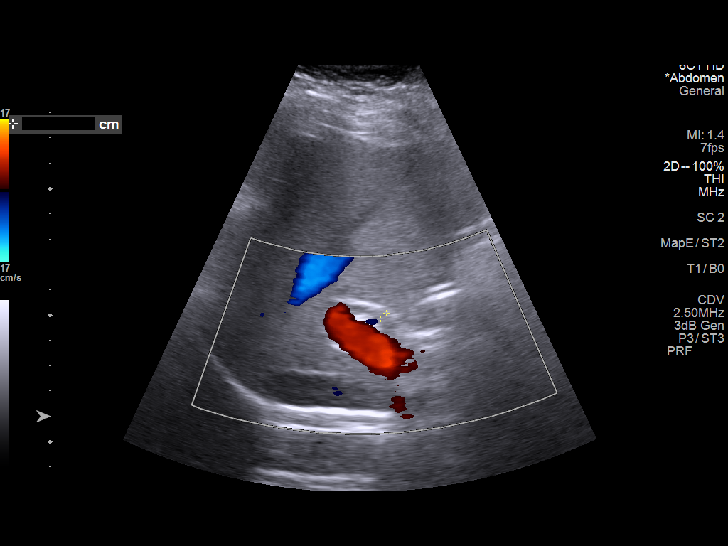
[im 11/41]
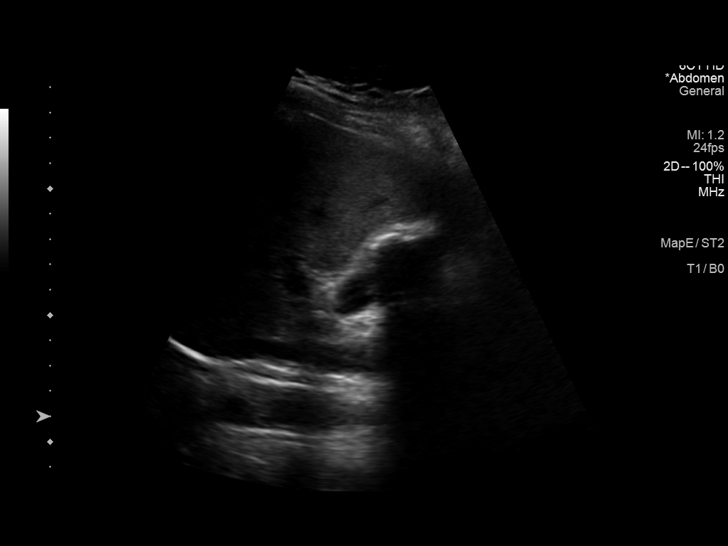
[im 14/41]
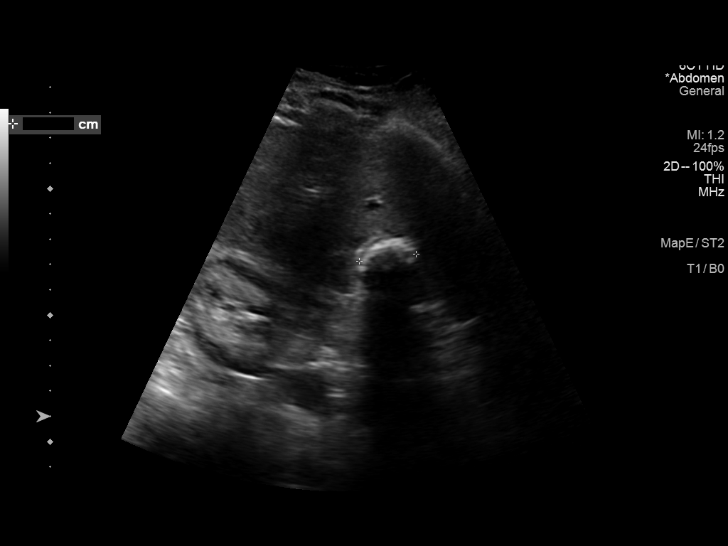
[im 16/41]
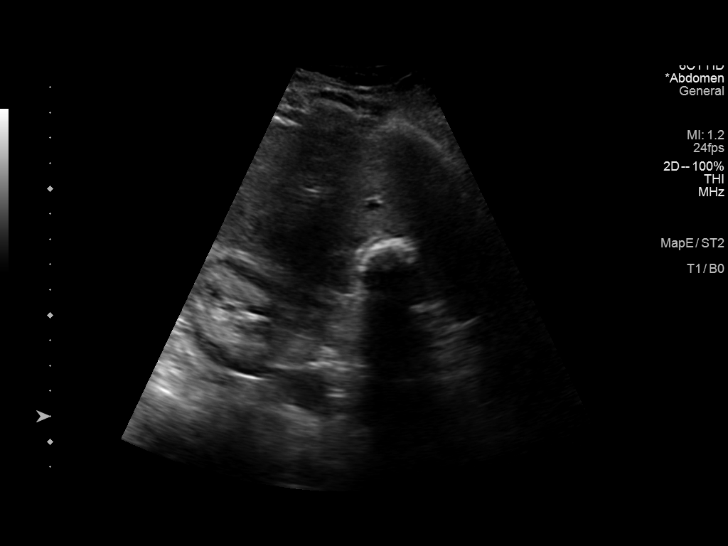
[im 19/41]
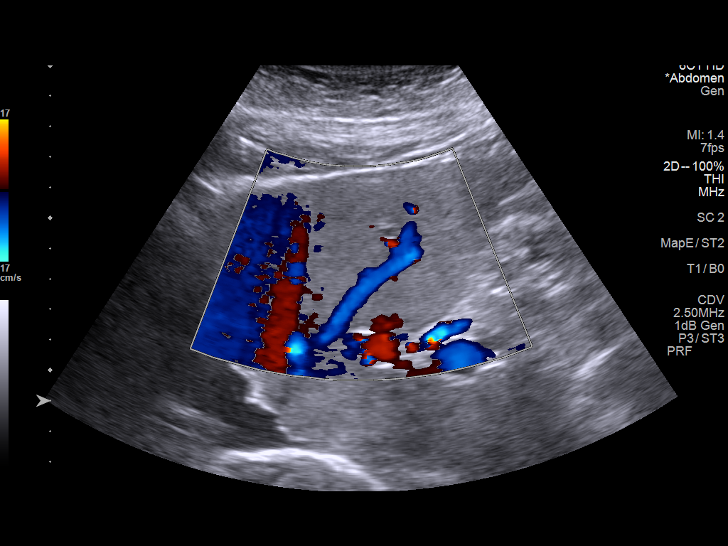
[im 22/41]
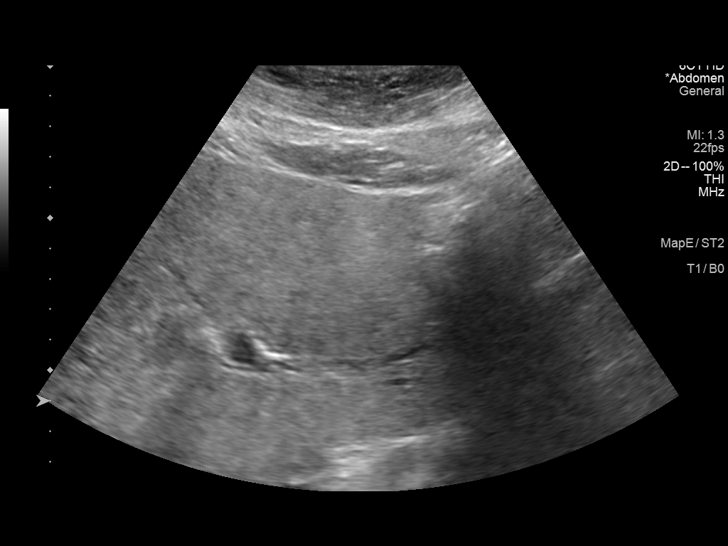
[im 26/41]
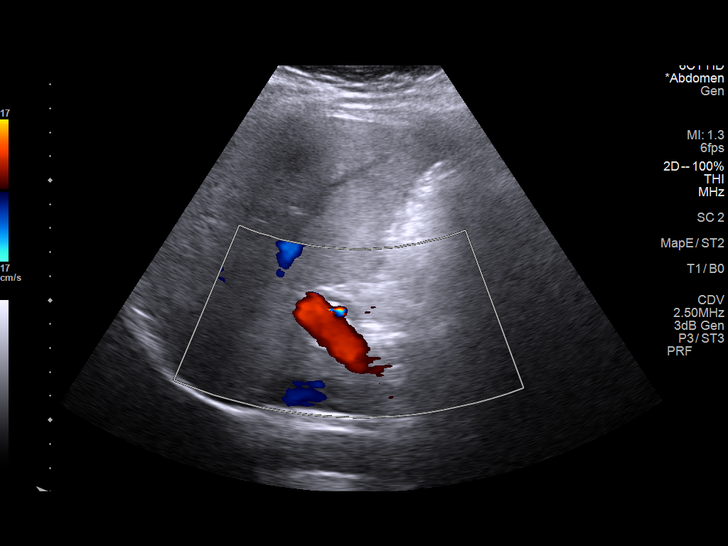
[im 27/41]
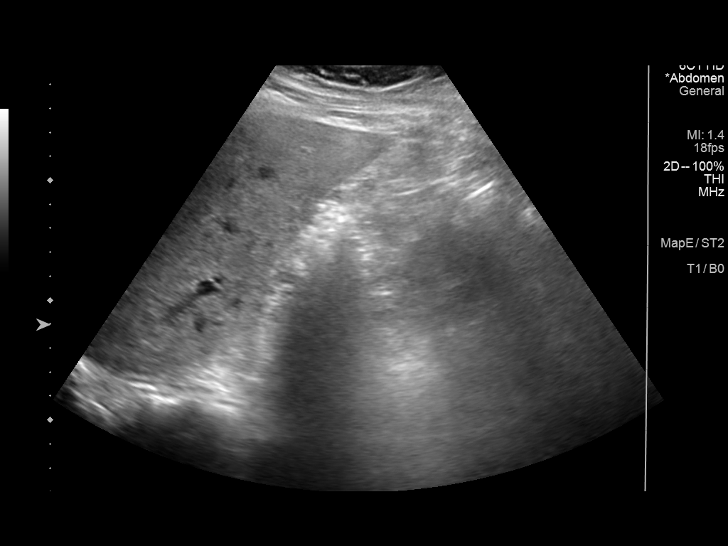
[im 31/41]
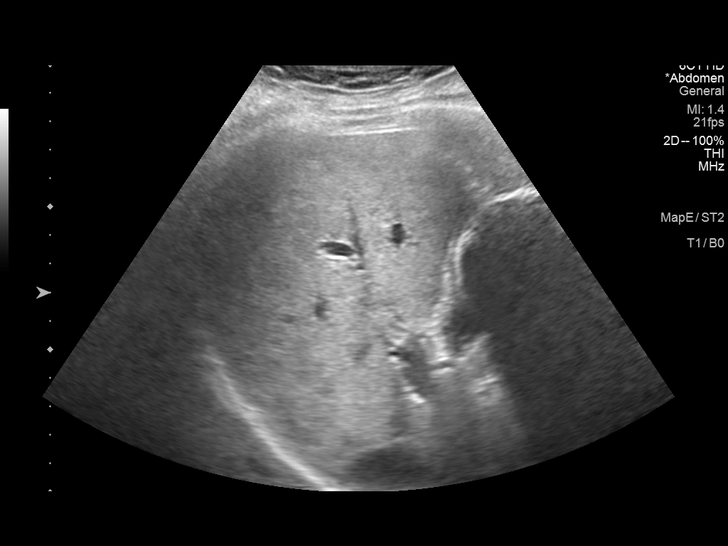
[im 34/41]
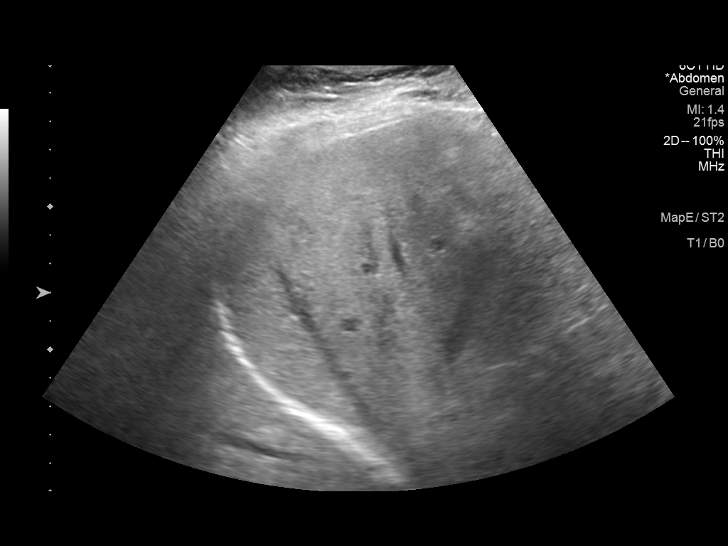
[im 37/41]
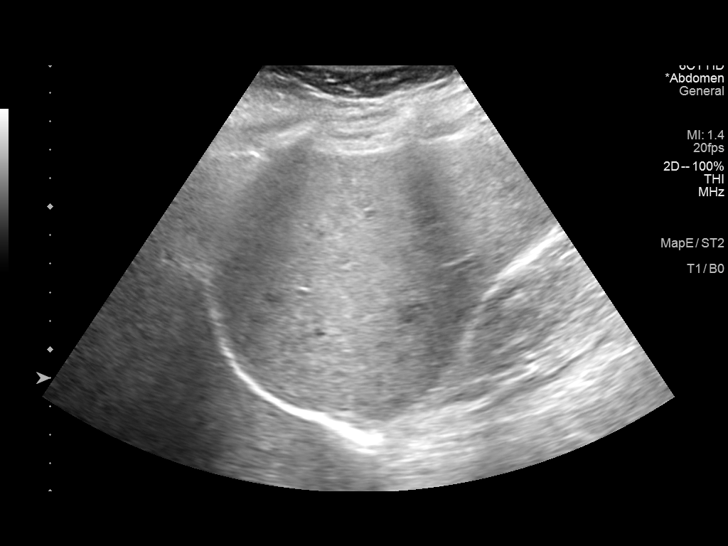
[im 41/41]
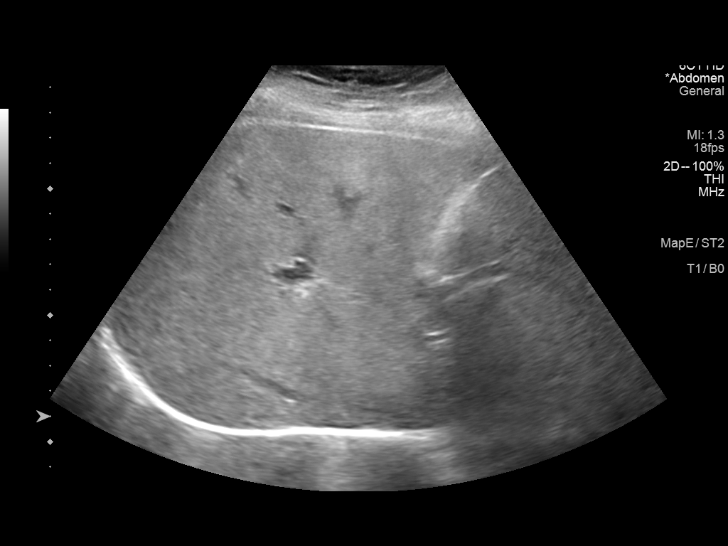

[14 of 25 positions shown; findings below may reference images not displayed]

FINDINGS: Gallbladder:

Echogenic structure in the right upper quadrant most likely
represents a large gallstone (wall echo shadow sign). The
gallbladder is contracted. No gallbladder wall thickening or
sonographic Murphy sign identified.

Common bile duct:

Diameter: 3 mm

Liver:

The hepatic echogenicity is increased. No focal lesions are
identified. Portal vein is patent on color Doppler imaging with
normal direction of blood flow towards the liver.

Other: The visualized right kidney demonstrates possible cortical
thinning and prominent renal sinus fat, similar to previous study.
No shadowing component or hydronephrosis.
IMPRESSION: 1. Probable gallstone-filled gallbladder (wall echo shadow sign). No
evidence of cholecystitis or biliary dilatation.
2. Echogenic liver, likely due to steatosis.

## 2023-09-06 ENCOUNTER — Encounter: Payer: Self-pay | Admitting: Gastroenterology

## 2023-09-11 ENCOUNTER — Ambulatory Visit
Admission: RE | Admit: 2023-09-11 | Discharge: 2023-09-11 | Disposition: A | Discharge status: Home / Self Care | Payer: BC Managed Care – PPO | Source: Ambulatory Visit | Attending: Gastroenterology | Admitting: Gastroenterology

## 2023-09-11 DIAGNOSIS — R1310 Dysphagia, unspecified: Secondary | ICD-10-CM

## 2023-10-19 DIAGNOSIS — Z01419 Encounter for gynecological examination (general) (routine) without abnormal findings: Secondary | ICD-10-CM | POA: Diagnosis not present

## 2023-10-19 DIAGNOSIS — Z1331 Encounter for screening for depression: Secondary | ICD-10-CM | POA: Diagnosis not present

## 2024-02-18 DIAGNOSIS — Z20818 Contact with and (suspected) exposure to other bacterial communicable diseases: Secondary | ICD-10-CM | POA: Diagnosis not present

## 2024-02-18 DIAGNOSIS — J029 Acute pharyngitis, unspecified: Secondary | ICD-10-CM | POA: Diagnosis not present

## 2024-05-26 DIAGNOSIS — N644 Mastodynia: Secondary | ICD-10-CM | POA: Diagnosis not present
